# Patient Record
Sex: Female | Born: 1970 | Race: White | Hispanic: No | Marital: Married | Smoking: Never smoker
Health system: Southern US, Community
[De-identification: ages and names within clinical notes are randomized; demographics above are authoritative.]

## PROBLEM LIST (undated history)

## (undated) DIAGNOSIS — K219 Gastro-esophageal reflux disease without esophagitis: Secondary | ICD-10-CM

## (undated) DIAGNOSIS — G35 Multiple sclerosis: Secondary | ICD-10-CM

## (undated) DIAGNOSIS — D649 Anemia, unspecified: Secondary | ICD-10-CM

## (undated) DIAGNOSIS — N3020 Other chronic cystitis without hematuria: Secondary | ICD-10-CM

## (undated) HISTORY — DX: Gastro-esophageal reflux disease without esophagitis: K21.9

## (undated) HISTORY — DX: Anemia, unspecified: D64.9

## (undated) HISTORY — PX: OTHER SURGICAL HISTORY: SHX169

## (undated) HISTORY — DX: Other chronic cystitis without hematuria: N30.20

## (undated) HISTORY — DX: Multiple sclerosis: G35

---

## 2000-04-30 ENCOUNTER — Other Ambulatory Visit: Admission: RE | Admit: 2000-04-30 | Discharge: 2000-04-30 | Payer: Self-pay | Admitting: Obstetrics and Gynecology

## 2000-07-23 ENCOUNTER — Inpatient Hospital Stay (HOSPITAL_COMMUNITY): Admission: AD | Admit: 2000-07-23 | Discharge: 2000-07-23 | Payer: Self-pay | Admitting: Obstetrics & Gynecology

## 2000-10-29 ENCOUNTER — Encounter: Admission: RE | Admit: 2000-10-29 | Discharge: 2000-10-29 | Payer: Self-pay | Admitting: Obstetrics and Gynecology

## 2000-10-29 ENCOUNTER — Encounter: Payer: Self-pay | Admitting: Obstetrics and Gynecology

## 2001-03-04 ENCOUNTER — Encounter: Payer: Self-pay | Admitting: Family Medicine

## 2001-03-04 ENCOUNTER — Encounter: Admission: RE | Admit: 2001-03-04 | Discharge: 2001-03-04 | Payer: Self-pay | Admitting: Family Medicine

## 2001-03-25 ENCOUNTER — Encounter: Payer: Self-pay | Admitting: Obstetrics and Gynecology

## 2001-03-25 ENCOUNTER — Encounter: Admission: RE | Admit: 2001-03-25 | Discharge: 2001-03-25 | Payer: Self-pay | Admitting: Family Medicine

## 2001-06-06 ENCOUNTER — Other Ambulatory Visit: Admission: RE | Admit: 2001-06-06 | Discharge: 2001-06-06 | Payer: Self-pay | Admitting: Obstetrics and Gynecology

## 2001-09-25 ENCOUNTER — Encounter: Admission: RE | Admit: 2001-09-25 | Discharge: 2001-09-25 | Payer: Self-pay | Admitting: Obstetrics and Gynecology

## 2001-09-25 ENCOUNTER — Encounter: Payer: Self-pay | Admitting: Obstetrics and Gynecology

## 2001-09-25 ENCOUNTER — Encounter (INDEPENDENT_AMBULATORY_CARE_PROVIDER_SITE_OTHER): Payer: Self-pay | Admitting: *Deleted

## 2002-09-17 ENCOUNTER — Other Ambulatory Visit: Admission: RE | Admit: 2002-09-17 | Discharge: 2002-09-17 | Payer: Self-pay | Admitting: Obstetrics and Gynecology

## 2003-09-23 ENCOUNTER — Other Ambulatory Visit: Admission: RE | Admit: 2003-09-23 | Discharge: 2003-09-23 | Payer: Self-pay | Admitting: Obstetrics and Gynecology

## 2003-12-15 ENCOUNTER — Ambulatory Visit (HOSPITAL_COMMUNITY): Admission: RE | Admit: 2003-12-15 | Discharge: 2003-12-15 | Payer: Self-pay | Admitting: Obstetrics and Gynecology

## 2004-07-19 ENCOUNTER — Inpatient Hospital Stay (HOSPITAL_COMMUNITY): Admission: AD | Admit: 2004-07-19 | Discharge: 2004-07-23 | Payer: Self-pay | Admitting: Obstetrics and Gynecology

## 2004-07-20 ENCOUNTER — Encounter (INDEPENDENT_AMBULATORY_CARE_PROVIDER_SITE_OTHER): Payer: Self-pay | Admitting: Specialist

## 2004-11-15 ENCOUNTER — Ambulatory Visit: Payer: Self-pay | Admitting: Family Medicine

## 2004-12-19 ENCOUNTER — Ambulatory Visit: Payer: Self-pay | Admitting: Family Medicine

## 2004-12-26 ENCOUNTER — Ambulatory Visit: Payer: Self-pay | Admitting: Family Medicine

## 2005-01-04 ENCOUNTER — Other Ambulatory Visit: Admission: RE | Admit: 2005-01-04 | Discharge: 2005-01-04 | Payer: Self-pay | Admitting: Obstetrics and Gynecology

## 2005-01-09 ENCOUNTER — Ambulatory Visit (HOSPITAL_COMMUNITY): Admission: RE | Admit: 2005-01-09 | Discharge: 2005-01-09 | Payer: Self-pay | Admitting: Obstetrics and Gynecology

## 2005-02-23 ENCOUNTER — Ambulatory Visit: Payer: Self-pay | Admitting: Cardiology

## 2005-03-02 ENCOUNTER — Ambulatory Visit: Payer: Self-pay

## 2005-03-06 ENCOUNTER — Ambulatory Visit: Payer: Self-pay | Admitting: Cardiology

## 2005-03-07 ENCOUNTER — Ambulatory Visit: Payer: Self-pay | Admitting: Internal Medicine

## 2005-03-08 ENCOUNTER — Inpatient Hospital Stay (HOSPITAL_COMMUNITY): Admission: EM | Admit: 2005-03-08 | Discharge: 2005-03-13 | Payer: Self-pay | Admitting: Emergency Medicine

## 2005-03-21 ENCOUNTER — Encounter: Admission: RE | Admit: 2005-03-21 | Discharge: 2005-06-19 | Payer: Self-pay | Admitting: Internal Medicine

## 2005-04-05 ENCOUNTER — Ambulatory Visit: Payer: Self-pay | Admitting: Family Medicine

## 2005-04-18 ENCOUNTER — Ambulatory Visit: Payer: Self-pay | Admitting: Family Medicine

## 2006-01-11 ENCOUNTER — Other Ambulatory Visit: Admission: RE | Admit: 2006-01-11 | Discharge: 2006-01-11 | Payer: Self-pay | Admitting: Obstetrics and Gynecology

## 2006-03-31 IMAGING — CR DG CHEST 2V
2 series · 2 of 2 positions shown · non-contrast
Comparison: none

CLINICAL DATA: Chest pain- left-sided.
 CHEST (TWO VIEWS):

 The heart size and mediastinal contours are normal. The lungs are clear. The visualized skeleton is unremarkable.

[view not recorded (1 of 2)]
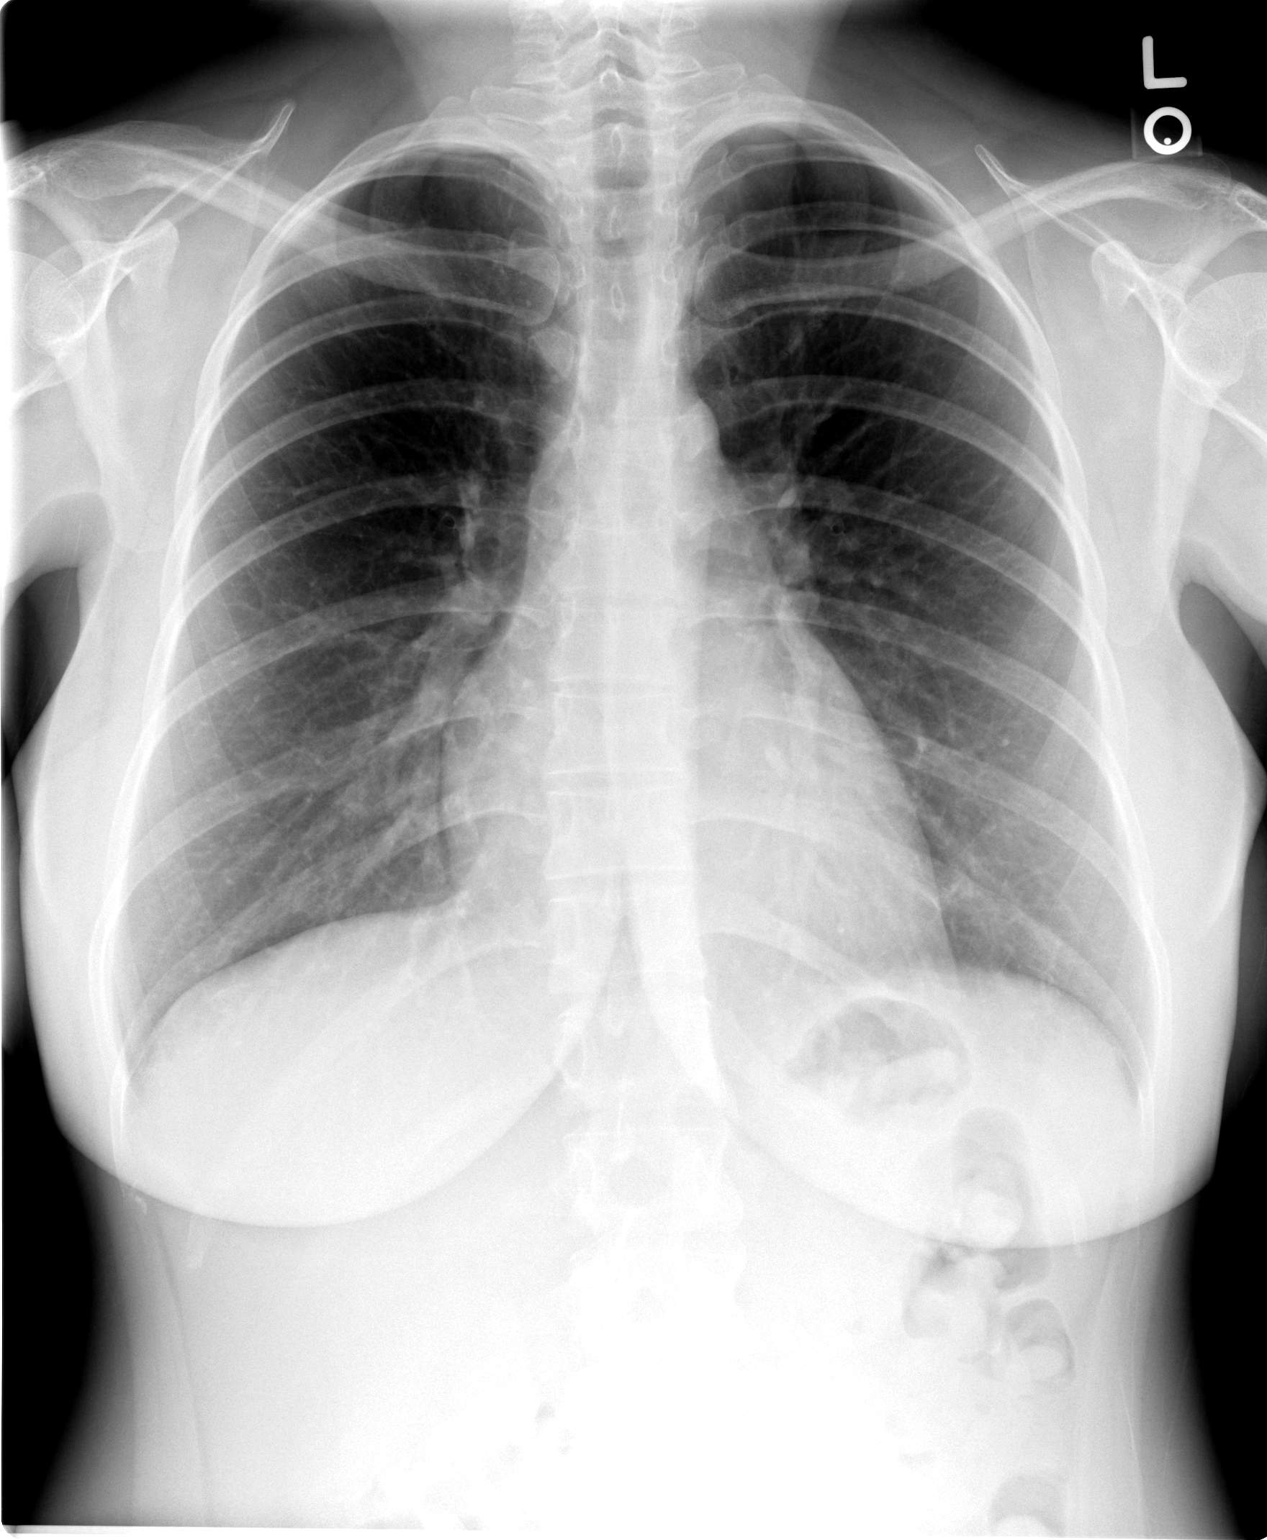

[view not recorded (2 of 2)]
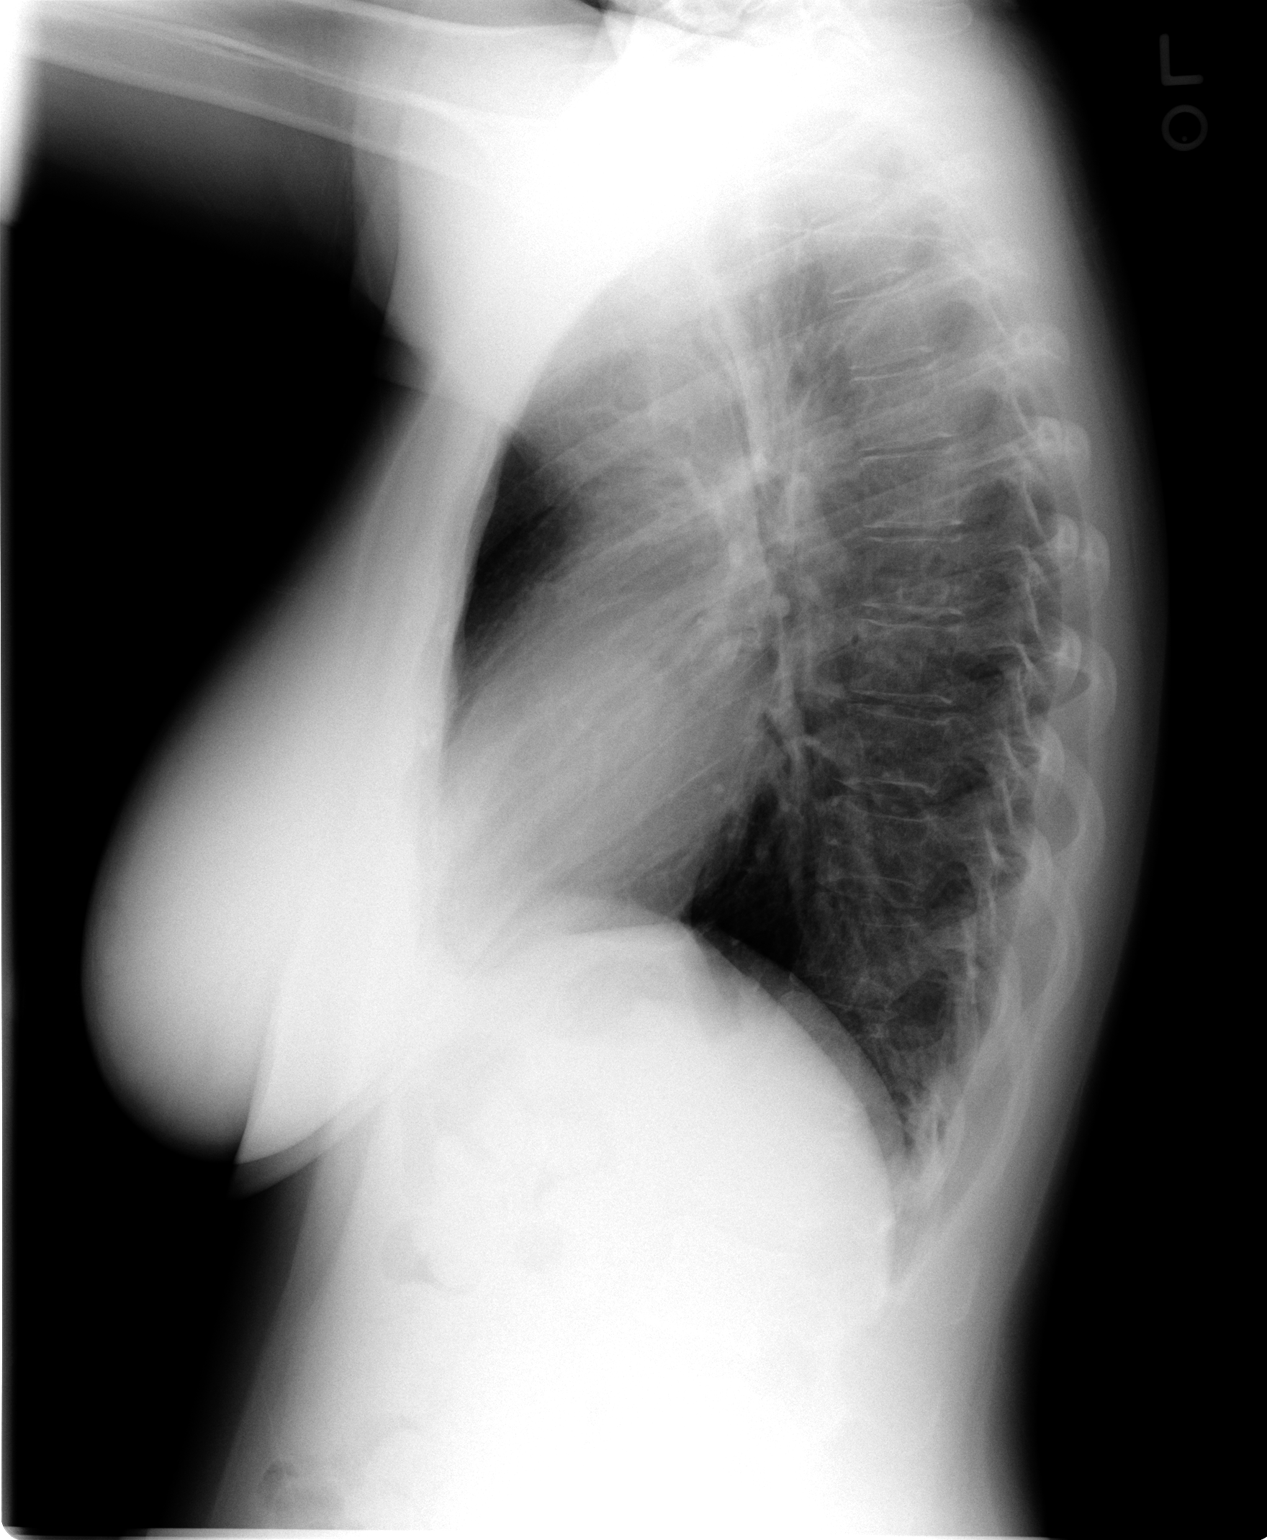

[2 of 2 positions shown; findings below may reference images not displayed]

IMPRESSION: No active disease.

## 2006-04-23 ENCOUNTER — Ambulatory Visit: Payer: Self-pay

## 2006-04-30 ENCOUNTER — Ambulatory Visit: Payer: Self-pay

## 2006-05-08 ENCOUNTER — Ambulatory Visit: Payer: Self-pay

## 2006-05-27 IMAGING — CT CT HEAD W/O CM
1 series · 16 of 30 positions shown, 20 images · IV contrast (agent unspecified)
Comparison: none

CLINICAL DATA: Status post fall.  Right-sided numbness and tingling. 
CT OF HEAD WITHOUT CONTRAST:
Routine unenhanced study was performed.  No comparison.  
  There is no evidence of acute intracranial hemorrhage, mass effect, or extraaxial fluid collection.  There is an ill-defined low attenuation lesion in the right parietal subcortical white matter, measuring approximately 6 mm in diameter on image #18.  No other white matter lesions are identified, and the overlying cortex in this area appears normal.  The ventricles and subarachnoid spaces are appropriately sized for age.  The visualized paranasal sinuses are clear, and the calvarium is intact.

[Series 2: brain · axial · 0.47mm/px · z∈[+99,+234]mm · 16 of 30 slices shown, 20 images]
[im 2/30  brain]
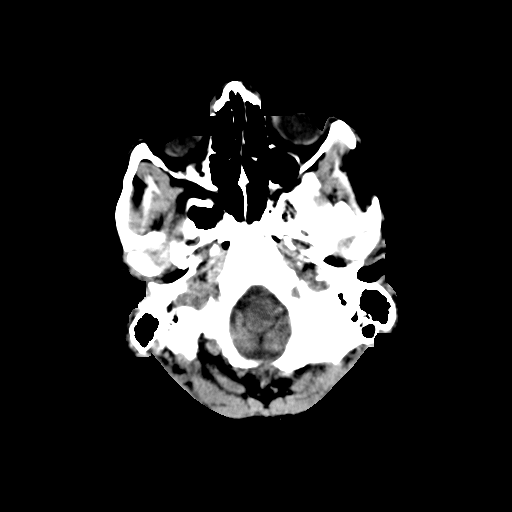
[im 2/30  bone]
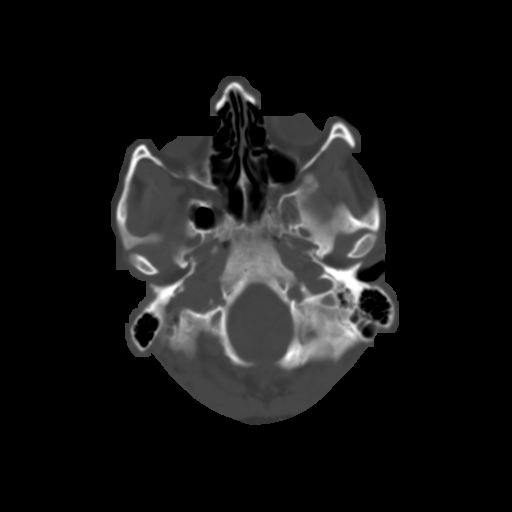
[im 4/30  brain]
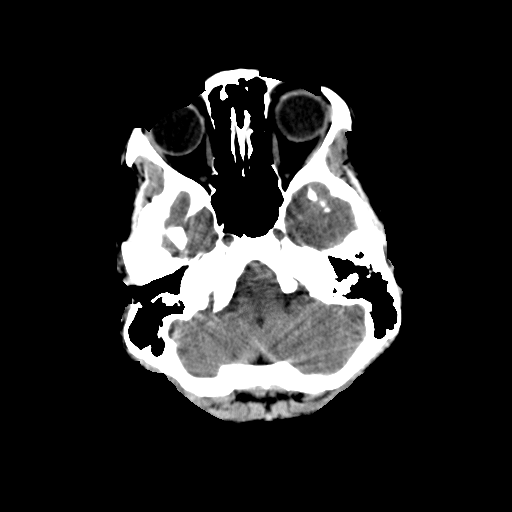
[im 6/30  brain]
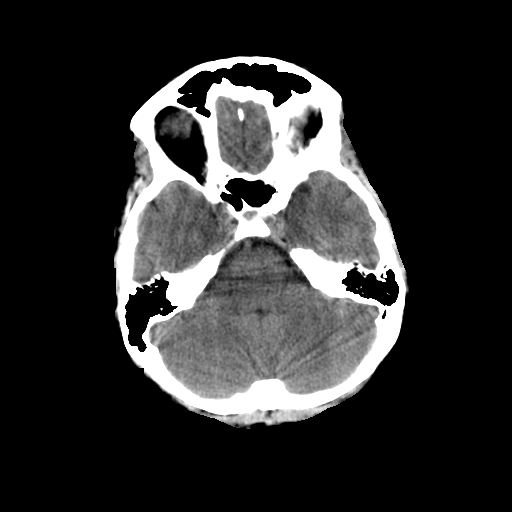
[im 8/30  brain]
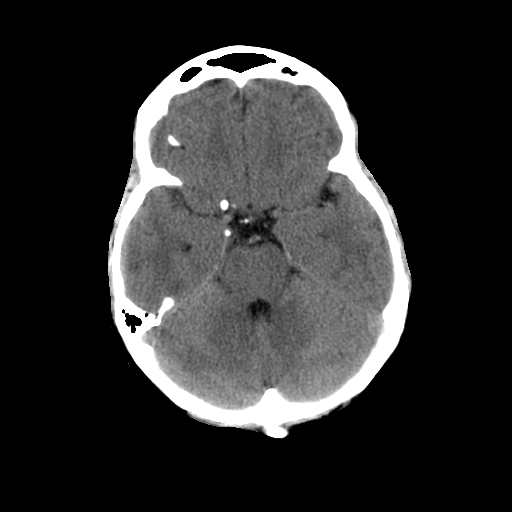
[im 9/30  brain]
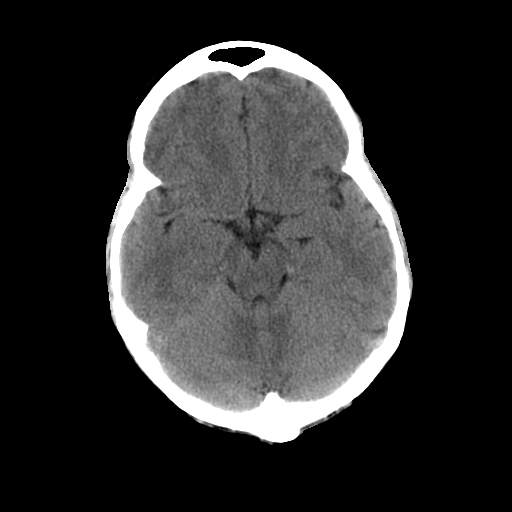
[im 9/30  bone]
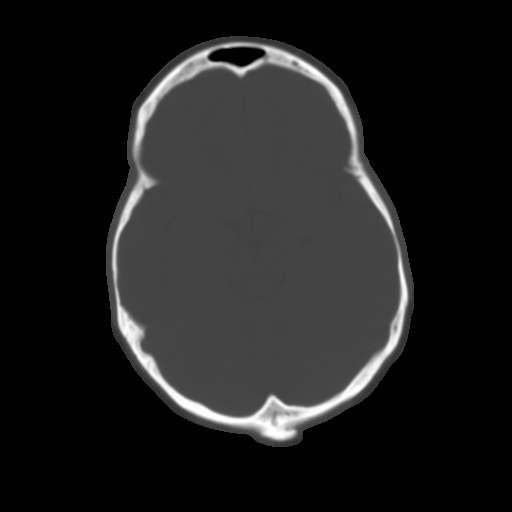
[im 11/30  brain]
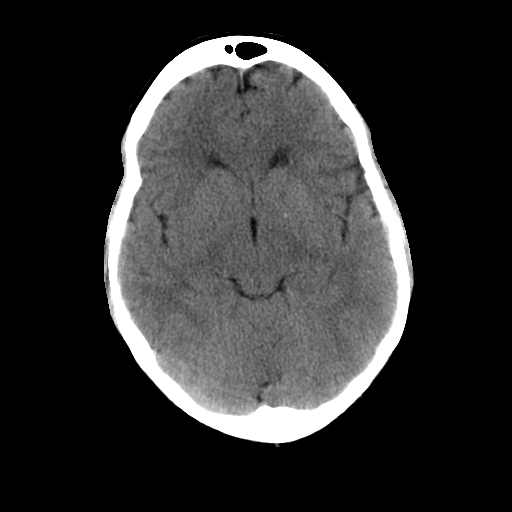
[im 13/30  brain]
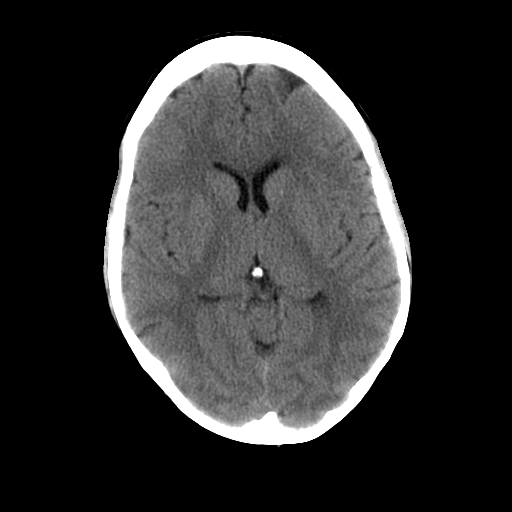
[im 15/30  brain]
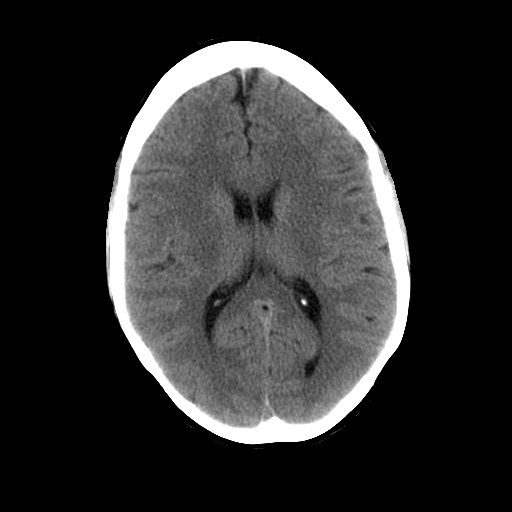
[im 16/30  brain]
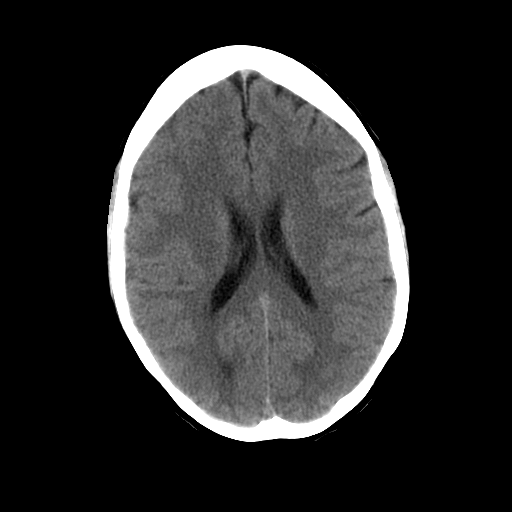
[im 16/30  bone]
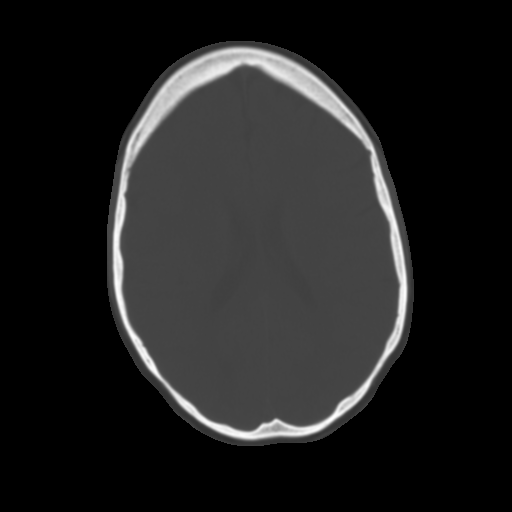
[im 18/30  brain]
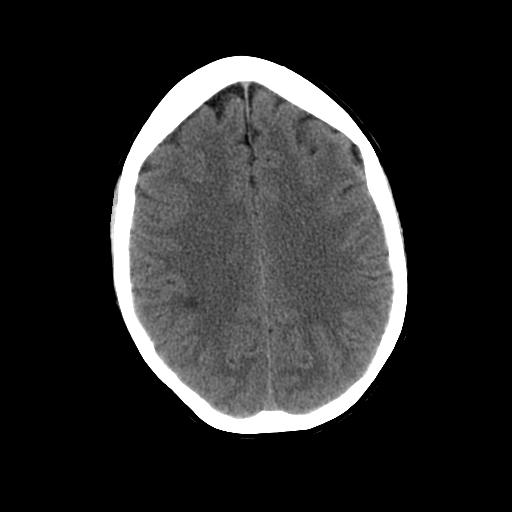
[im 20/30  brain]
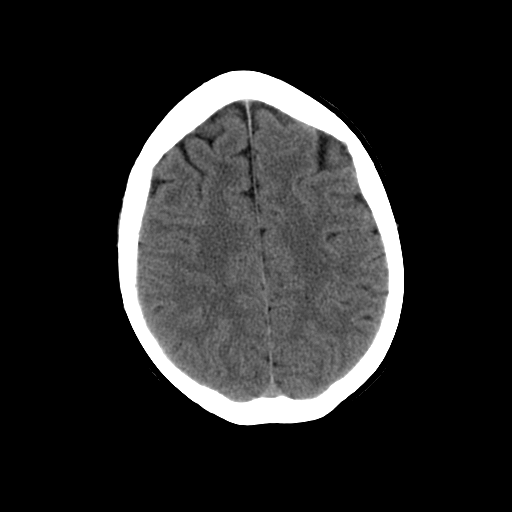
[im 22/30  brain]
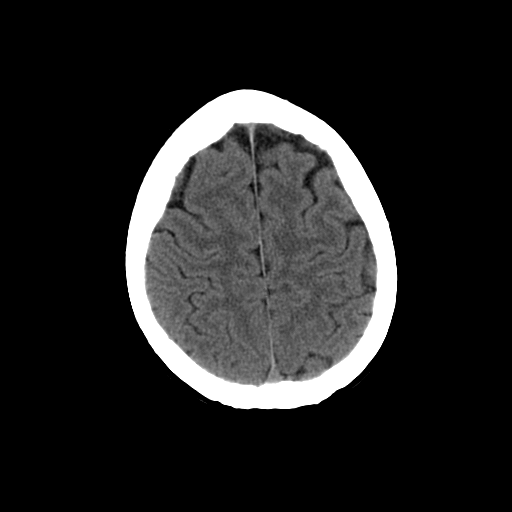
[im 23/30  brain]
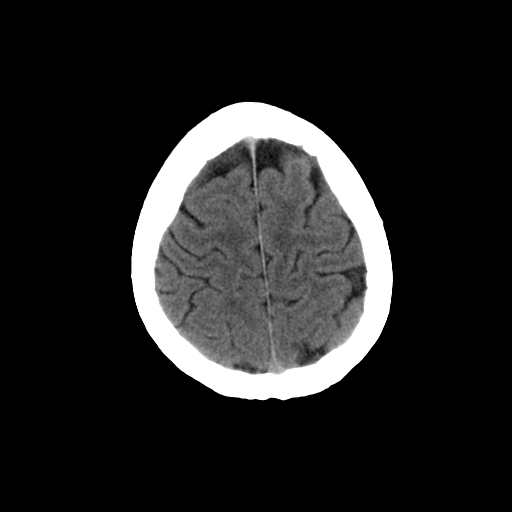
[im 23/30  bone]
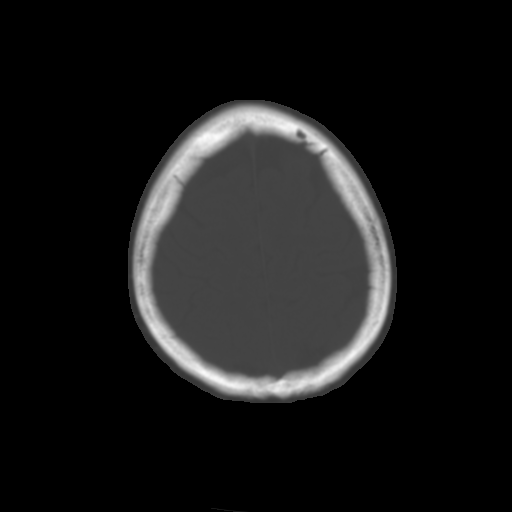
[im 25/30  brain]
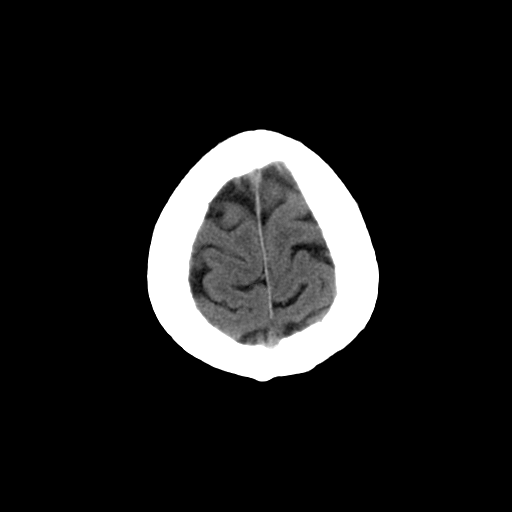
[im 27/30  brain]
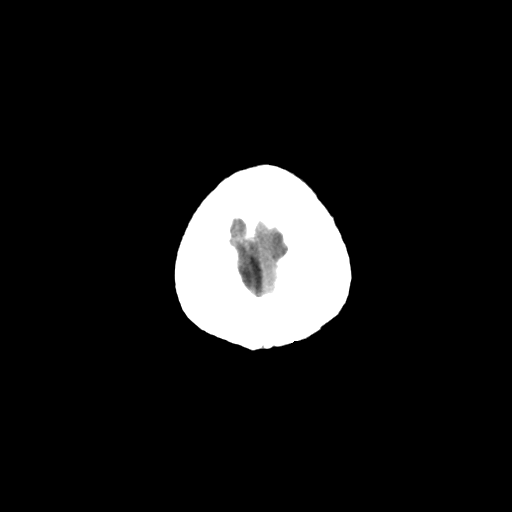
[im 29/30  brain]
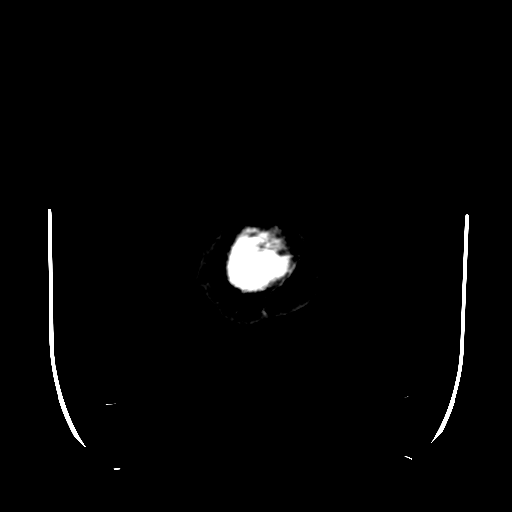

[16 of 30 positions shown; findings below may reference images not displayed]

IMPRESSION: Nonspecific right parietal subcortical white matter lesion could potentially reflect a subacute stroke but would not explain the given symptoms.  Other considerations would include a demyelinating process, the sequela of remote trauma, and less likely neoplasm or infection.  This is a finding which would be better evaluated with MRI of the brain to include postcontrast images.

## 2006-05-29 ENCOUNTER — Ambulatory Visit: Payer: Self-pay

## 2006-06-11 ENCOUNTER — Ambulatory Visit: Payer: Self-pay

## 2006-06-25 ENCOUNTER — Ambulatory Visit: Payer: Self-pay

## 2006-07-03 ENCOUNTER — Ambulatory Visit: Payer: Self-pay

## 2006-07-16 ENCOUNTER — Ambulatory Visit: Payer: Self-pay

## 2006-08-13 ENCOUNTER — Ambulatory Visit: Payer: Self-pay

## 2006-08-20 ENCOUNTER — Ambulatory Visit: Payer: Self-pay

## 2006-08-27 ENCOUNTER — Ambulatory Visit: Payer: Self-pay

## 2006-09-18 ENCOUNTER — Ambulatory Visit: Payer: Self-pay

## 2006-10-02 ENCOUNTER — Ambulatory Visit: Payer: Self-pay

## 2006-10-22 ENCOUNTER — Ambulatory Visit: Payer: Self-pay

## 2007-01-21 ENCOUNTER — Ambulatory Visit: Payer: Self-pay

## 2007-03-11 ENCOUNTER — Ambulatory Visit: Payer: Self-pay

## 2007-06-11 ENCOUNTER — Ambulatory Visit: Payer: Self-pay

## 2008-09-07 ENCOUNTER — Ambulatory Visit: Payer: Self-pay

## 2009-05-13 ENCOUNTER — Encounter: Admission: RE | Admit: 2009-05-13 | Discharge: 2009-05-13 | Payer: Self-pay | Admitting: Gastroenterology

## 2009-06-07 ENCOUNTER — Ambulatory Visit: Payer: Self-pay | Admitting: Cardiology

## 2009-06-07 DIAGNOSIS — R0602 Shortness of breath: Secondary | ICD-10-CM | POA: Insufficient documentation

## 2010-09-19 ENCOUNTER — Encounter: Admission: RE | Admit: 2010-09-19 | Discharge: 2010-09-19 | Payer: Self-pay | Admitting: Psychiatry

## 2010-11-14 ENCOUNTER — Encounter: Admission: RE | Admit: 2010-11-14 | Discharge: 2010-11-14 | Payer: Self-pay | Admitting: Obstetrics and Gynecology

## 2011-03-31 ENCOUNTER — Emergency Department (HOSPITAL_COMMUNITY)
Admission: EM | Admit: 2011-03-31 | Discharge: 2011-03-31 | Disposition: A | Discharge status: Home / Self Care | Payer: Commercial Managed Care - PPO | Attending: Emergency Medicine | Admitting: Emergency Medicine

## 2011-03-31 ENCOUNTER — Emergency Department (HOSPITAL_COMMUNITY): Payer: Commercial Managed Care - PPO

## 2011-03-31 DIAGNOSIS — R51 Headache: Secondary | ICD-10-CM | POA: Insufficient documentation

## 2011-03-31 DIAGNOSIS — S1093XA Contusion of unspecified part of neck, initial encounter: Secondary | ICD-10-CM | POA: Insufficient documentation

## 2011-03-31 DIAGNOSIS — Z79899 Other long term (current) drug therapy: Secondary | ICD-10-CM | POA: Insufficient documentation

## 2011-03-31 DIAGNOSIS — R110 Nausea: Secondary | ICD-10-CM | POA: Insufficient documentation

## 2011-03-31 DIAGNOSIS — Y9229 Other specified public building as the place of occurrence of the external cause: Secondary | ICD-10-CM | POA: Insufficient documentation

## 2011-03-31 DIAGNOSIS — M542 Cervicalgia: Secondary | ICD-10-CM | POA: Insufficient documentation

## 2011-03-31 DIAGNOSIS — K219 Gastro-esophageal reflux disease without esophagitis: Secondary | ICD-10-CM | POA: Insufficient documentation

## 2011-03-31 DIAGNOSIS — R209 Unspecified disturbances of skin sensation: Secondary | ICD-10-CM | POA: Insufficient documentation

## 2011-03-31 DIAGNOSIS — F329 Major depressive disorder, single episode, unspecified: Secondary | ICD-10-CM | POA: Insufficient documentation

## 2011-03-31 DIAGNOSIS — IMO0002 Reserved for concepts with insufficient information to code with codable children: Secondary | ICD-10-CM | POA: Insufficient documentation

## 2011-03-31 DIAGNOSIS — G35 Multiple sclerosis: Secondary | ICD-10-CM | POA: Insufficient documentation

## 2011-03-31 DIAGNOSIS — F3289 Other specified depressive episodes: Secondary | ICD-10-CM | POA: Insufficient documentation

## 2011-03-31 DIAGNOSIS — S0003XA Contusion of scalp, initial encounter: Secondary | ICD-10-CM | POA: Insufficient documentation

## 2011-04-24 ENCOUNTER — Other Ambulatory Visit: Payer: Self-pay | Admitting: Psychiatry

## 2011-04-24 DIAGNOSIS — G35 Multiple sclerosis: Secondary | ICD-10-CM

## 2011-04-27 NOTE — Discharge Summary (Signed)
NAME:  Savannah Kennedy, Savannah Kennedy                     ACCOUNT NO.:  [id number]   MEDICAL RECORD NO.:  [id number]                   PATIENT TYPE:   LOCATION:                                       FACILITY:   PHYSICIAN:  Malva Limes, M.D.                 DATE OF BIRTH:   DATE OF ADMISSION:  07/19/2004  DATE OF DISCHARGE:  07/23/2004                                 DISCHARGE SUMMARY   FINAL DIAGNOSES:  1.  Intrauterine pregnancy at [redacted] weeks gestation.  2.  Arrest of the active stage of labor.   PROCEDURE:  Primary low flap transverse cesarean section.   SURGEON:  Dr. Miguel Aschoff.   COMPLICATIONS:  None.   This 40 year old G3 P0-0-1-0 presents at [redacted] weeks gestation with spontaneous  rupture of membranes.  The patient's antepartum course up to this point had  been uncomplicated.  She did have a positive group B strep culture performed  in the office at 35 weeks.  The patient's only other problem is she has had  a history of reflux that she was on Protonix for throughout her pregnancy.  Otherwise, it had been uncomplicated.  The patient was admitted at this  time.  She was given clindamycin for her positive group B strep secondary to  a PENICILLIN allergy.  The patient was induced because of the rupture of  membranes and progressed to a rim, but had arrest of labor at this point  with no progress for over 2 hours and became febrile.  The anterior lip did  note to be become swollen and because of this arrest of dilatation with  associated fever and slow progression, the patient was taken to the  operating room to undergo a cesarean section.  She was taken to the  operating room by Dr. Miguel Aschoff where a primary low transverse cesarean  section was performed with the delivery of a 7-pound 8-ounce female infant  with Apgars of 8 and 9.  Delivery went without complications.  The patient's  postoperative course was benign without any significant complications.  She  was having some difficulty  focusing on postoperative day #1 but had no  headaches and no other symptoms.  By postoperative day #2 her eyes were  feeling much better and she was felt ready for discharge on postoperative  day #3.  She was sent home on a regular diet, told to decrease activities,  told to restart her Protonix for her reflux, was given Vicodin one to two  q.4h. as needed for pain, told she could use ibuprofen q.6h. as needed for  pain, told to continue her prenatal vitamins.  She was also told to follow  up in the office in 4 weeks.   LABORATORY DATA ON DISCHARGE:  The patient had a hemoglobin of 8.3, white  blood cell count of 11.3.     Leilani Able, P.A.-C.  Malva Limes, M.D.    MB/MEDQ  D:  08/21/2004  T:  08/21/2004  Job:  [id number]

## 2011-04-27 NOTE — Op Note (Signed)
NAMESAVAHANNA, Savannah Kennedy                   ACCOUNT NO.:  [id number]   MEDICAL RECORD NO.:  [id number]                   PATIENT TYPE:  INP   LOCATION:  9137                                 FACILITY:  [facility]   PHYSICIAN:  Miguel Aschoff, M.D.                    DATE OF BIRTH:  1971/05/26   DATE OF PROCEDURE:  07/21/2004  DATE OF DISCHARGE:                                 OPERATIVE REPORT   PREOPERATIVE DIAGNOSIS:  Intrauterine pregnancy at 38 weeks with arrest of  the active stage of labor   POSTOPERATIVE DIAGNOSIS:  Intrauterine pregnancy at 38 weeks with arrest of  the active stage of labor.  Delivery of viable female infant, Apgar 8 and 9.   PROCEDURE:  Primary low flap transverse cesarean section.   SURGEON:  Dr. Miguel Aschoff   ANESTHESIA:  Epidural.   COMPLICATIONS:  None.   JUSTIFICATION:  The patient is a 40 year old white female, gravida 2, para 0-  0-1-0, at 60 weeks, who presented to [facility] with spontaneous  rupture of membranes.  The patient was induced because of rupture of  membranes and progressed to a rim but had arrest of labor at this point with  no progress for over 2 hours.  She then became febrile.  The anterior lip  previously noted became more swollen and due to this arrest of dilatation  with the associated fever and very slow progress falling her off the normal  Friedman curve, she was taken to operating room to undergo primary cesarean  section.  The risks and benefits of the procedure were discussed with the  patient.   DESCRIPTION OF PROCEDURE:  The patient was taken to the operating room,  placed in a supine position, and prepped and draped in the usual sterile  fashion.  Her previously placed epidural anesthetic was reinjected, and a  satisfactory level of anesthesia was achieved without difficulty.  At this  point, a Pfannenstiel incision was made, extended down through the  subcutaneous tissue with bleeding points being clamped and coagulated  as  they were encountered.  The fascia was then identified and incised  transversely, separated from the underlying rectus muscles.  The rectus  muscles were divided in the midline.  Peritoneum was then found and entered,  carefully avoiding underlying structures.  A peritoneal incision was  extended under direct visualization.  At this point, a bladder flap was  created and protected with the bladder blade.  Then an elliptical transverse  incision was made into the lower uterine segment.  The amniotic cavity was  entered.  Clear fluid was obtained and, at this point, the patient was  delivered of a viable female infant, Apgars 8 at 1 minute and 9 at 5 minutes  from a vertex ROA position.  Nuchal cord x 1 was noted.  The baby was handed  to the pediatric team in attendance.  Cord bloods were obtained for  appropriate studies.  The placenta was delivered.  The uterus was evacuated  of any remaining products of conception.  At this point, the angles of the  uterine incision were ligated using figure-of-eight sutures of #1 Vicryl.  Then the uterus was closed in layers.  The first layer was a running  interlocking suture of #1 Vicryl followed by an imbricating suture of #1  Vicryl.  Once this was done, the bladder flap was reapproximated using  running continuous 2-0 Vicryl suture.  Inspection was then made for  hemostasis.  Hemostasis appeared to be excellent and at this point, lap  counts were taken, found to be correct, and then the abdomen was closed.  The parietal peritoneum was closed using running continuous 0 Vicryl suture.  The fascia was closed using 2 sutures of 0 Vicryl, each starting at the  lateral fascial angles and then meeting in the midline.  The subcutaneous  tissue was closed using interrupted 0 Vicryl sutures, and  the skin incision was closed using staples.  The estimated blood loss was  approximately 600 mL.  The patient tolerated the procedure well and was  taken to the  recovery room in satisfactory condition.  The baby was taken to  the nursery in satisfactory condition.                                               Miguel Aschoff, M.D.    AR/MEDQ  D:  07/21/2004  T:  07/23/2004  Job:  [id number]

## 2011-04-27 NOTE — Procedures (Signed)
HISTORY:  The patient is 39 year old woman with a hemiplegia being evaluated  to rule out multiple sclerosis.   PROCEDURE:  The tracing is carried with a 32 x 32 check pattern oscillating  at 1.9 Hz.  A 250 msec period was displayed following with stimulus  artifact.  Filters ranged between 1-100 Hz (low to high) and 100 stimuli  were averaged in duplicate to provide the final results.   DESCRIPTION OF FINDINGS:  The left eye produced well-defined wave forms with  excellent correlation.  Latencies were as follows:  N1: 65.5, P1: 94.8, N2:  124.1.   Similarly, stimulation of the right eye produced well-defined wave forms  with excellent correlation.  Latencies were as follows:  N1: 68.4, P1: 95.8,  N2: 124.1.   IMPRESSION:  These pattern reversal visual responses are within normal  limits and showed no evidence of conduction abnormality in the anterior  visual pathways of either eye.      JYN:WGNF  D:  03/09/2005 19:26:56  T:  03/10/2005 08:25:39  Job #:  [id number]   cc:   Melvyn Novas, M.D.  [address]  [address]  [place]  [place] [address]  Fax: [phone]

## 2011-04-27 NOTE — H&P (Signed)
Savannah Kennedy, GUTTER         ACCOUNT NO.:  [id number]   MEDICAL RECORD NO.:  [id number]          PATIENT TYPE:  EMS   LOCATION:  MAJO                         FACILITY:  [facility]   PHYSICIAN:  Georgina Quint. Plotnikov, M.D. LHCDATE OF BIRTH:  1971-09-07   DATE OF ADMISSION:  03/07/2005  DATE OF DISCHARGE:                                HISTORY & PHYSICAL   CHIEF COMPLAINT:  Left-sided weakness, tingling, and numbness.   HISTORY OF PRESENT ILLNESS:  The patient is a 40 year old female who was  going downstairs at around 4:30 p.m. when she abruptly lost sensation of her  left leg, left arm, and fell.  Her mother said that the fall was quite hard.  She denies loss of consciousness.  She has been having tingling and numbness  in different parts of her body lasting from 20 minutes to several hours,  headaches, head heaviness, occasional blurred vision for several months'  duration.  Usually the symptoms are more pronounced on the left side than on  the right and mostly they are described as a heavy sensation.  She thinks  that the symptoms started in August of 2005 when she had her baby.  She had  a cesarean section.  She had a brain MRI at [facility] 2 weeks  ago which apparently was normal.  She saw Carolan Shiver, M.D. for dizziness  and vertigo.  She saw [name], M.D. and apparently had a normal  echocardiogram and an EKG.  She has an appointment with Clabe Seal. Meryl Crutch,  M.D. as I understand in 2 weeks.   PAST MEDICAL HISTORY:  Unremarkable.  Her pregnancy was terminated in 2001  early due to nausea and vomiting, as I understand.  She had a cesarean  section in August of 2005.   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  No alcohol, drugs,  or tobacco.   FAMILY HISTORY:  Both aunts had mini-strokes in their 30's or 40's, she is  not clear about the details.  No miscarriages or blood clots in the family.   REVIEW OF SYSTEMS:  As above, some memory  problems, some anxiety.  Headaches  have improved.  No abdominal pains.  No syncope.  No injuries.   CURRENT MEDICATIONS:  Ch[place] for allergies p.r.n.  Not on birth  control pills.  No supplements.   ALLERGIES:  GUAIFENESIN, ERYTHROMYCIN.   PHYSICAL EXAMINATION:  VITAL SIGNS:  Temperature 99.9, blood pressure  132/92, heart rate 100, respirations 22, saturations normal.  GENERAL:  She is in no acute distress.  Anxious.  HEENT:  Moist mucosa.  NECK:  Supple.  No thyromegaly or bruit.  LUNGS:  Clear.  No wheezes or rales.  HEART:  S1 and S2.  No murmur and no gallop.  ABDOMEN:  Soft, nontender, no organomegaly, and no mass felt.  EXTREMITIES:  Lower extremities without edema.  Calves nontender.  Muscle  strength examination reveals 5-/5 left foot extensors, 5-/5 left arm  extensors and flexors.  Left grip is only slightly decreased.  There is no  facial droop.  Speech is normal.  Pupils are reactive.  She is alert,  oriented, and cooperative.   LABORATORY DATA:  CT scan showed right parietal white matter lesion,  subacute versus old CVA versus demyelinating process versus old injury.   Glucose 106, urinalysis 3 to 6 WBC's, CBC's normal.   EKG with normal sinus rhythm.   Pregnancy urine test negative.   ASSESSMENT:  1.  Left hemiparesis with left-sided paresthesias.  Admit to telemetry.  The      diagnosis is unclear.  Will give aspirin.  Neurology consultation in the      morning.  Obtain blood work including B12, TSH, sed rate,      hypercoagulable panel, and others.  2.  Right parietal lesion.  Per patient she had a normal MRI at [facility] 2 weeks ago.  Will obtain the report and will obtain MRI/MRA.  3.  Anxiety.  Will use Ativan p.r.n. and Valium.  4.  Possible urinary tract infection.  Will treat with a short course of      antibiotic.      AVP/MEDQ  D:  03/08/2005  T:  03/08/2005  Job:  [id number]   cc:   Laurita Quint, M.D.  [address] [place]  [place] [address]  Fax: [phone]   [name], M.D.   Miguel Aschoff, M.D.  [address]  [place]  [place] [address]  Fax: [phone]   Carolan Shiver, M.D.  Fax: [phone]

## 2011-04-27 NOTE — Consult Note (Signed)
NAMEJANIA, Savannah Kennedy NO.:  [id number]   MEDICAL RECORD NO.:  [id number]          PATIENT TYPE:  INP   LOCATION:  3024                         FACILITY:  [facility]   PHYSICIAN:  Melvyn Novas, M.D.  DATE OF BIRTH:  [birth year]   DATE OF CONSULTATION:  DATE OF DISCHARGE:                                   CONSULTATION   HISTORY OF PRESENT ILLNESS:  [name] is a 40 year old,  married, Caucasian, right-handed female, who presents with the sudden onset  of left hemiparesis yesterday which caused her to fall.  Preceding symptoms  have been pretty much since she delivered her baby eight months ago with  left/right hemisensory impairment in the form of tingling and numbness.  Sometimes, this has involved the left body and left leg, but she does not  remember having sensory loss over the face.  She also does not state that  she had any cognitive or cranial nerve deficits, but she was very tearful  and was diagnosed with postnatal depression.  Recently, she had complained  about transient blurring of vision, especially in the morning.  There have  been many days when she did not have any symptoms, and then she would have  sudden recurrence of sensory loss or blurring of vision for a couple of  hours.  Normally, the onset was seen in the morning.  Delivery of her first  baby was a normal vaginal delivery with epidural anesthesia.  She has had no  recent medication changes, and she is currently not nursing.  The patient  states that she had a MRI at [facility] which was reported as negative for  lesions on February 20, 2005.   FAMILY HISTORY:  The patient has a grandfather with Parkinson's syndrome.  Her parents are healthy.   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  She has not been  working outside the home for the last year.   PHYSICAL EXAMINATION:  VITAL SIGNS:  Respiratory rate 16, vital signs are  stable.  The patient is afebrile at 97.8.   Blood pressure, which was taken  after the patient became very tearful and upset, was 123/80; heart rate 88,  is now 78 and regular.  GENERAL:  No edema, no cyanosis, or clubbing.  No lymph node extension, neck  vein distention, no goiters noticed.  The patient is  atraumatic/normocephalic.  Her fall has not led to any bruising.  The  patient needed assistance to walk to the bathroom today, but feels that she  has had good regainment of her left-sided strength.  MENTAL STATUS:  Alert and oriented x3, fluent speech, appropriate memory and  concentration capacity.  CRANIAL NERVE EXAMINATION: Pupils reactive bi-  equally to light, however, the left side seems to be slower.  The patient  has normal full extraocular movements and bilateral equal visual fields.  No  cataracts.  No papilledema.  No facial droop.  Tongue and uvula are midline.  Range of motion for the neck is full.  There is no shoulder shrug asymmetry.  MOTOR EXAM: Shows equal grip, but the left intrinsic finger muscles  seem to  be weaker, and she cannot spread her fingers with the same strength and  resistance as on the right.  She has equal strength for antigravity, elbow  flexion, elbow extension, dorsiflexion, and plantar flexion.  There is no  cogwheeling and no spasticity.  The deep tendon reflexes are equal  bilaterally, but the patient did present with an upgoing toe on the left.  Sensory at this time is equal to touch.  She has no remaining numbness,  tingling, or burning.  COORDINATION:  The patient said that she felt dizzy earlier this morning and  describes a vertigo spell.  Finger-nose here is intact, and there is no  nystagmus noticed.  She could not perform the Romberg, as she felt unable to  stand up for me, but she can sit up in the bed without any recurrence of  ataxia or truncal dysmetria.  There is no tremor and no ataxia noted on  finger-nose.   ASSESSMENT:  Very likely, by the onset of sensory symptoms,  visual symptoms,  and now motor symptoms, a course of multiple sclerosis in this 40 year old  Caucasian female with recent delivery of her first baby.  MRI here will  confirm demyelination.  CSF is pending, but would actually not be needed to  confirm the diagnosis given the periventricular distribution of a  demyelinating lesion and a confluent demyelination between corpus callosum  and subcortical U-fibers in the right brain.   PLAN:  The patient will receive Solu-Medrol 1 g IV today, and we might give  it for 3 or 4 weeks, perhaps even 5, depending on her progression.  Protonix  will also be given p.o. 40 mg a day.  She will be evaluated by PT and OT,  and visual evoked potentials were ordered.  The patient is currently too  upset to do a spinal tap.  I have suggested to use p.r.n. Xanax.  She might  also need something to sleep tonight after she receives steroids IV.  Follow-  up visit in 14 days.      CD/MEDQ  D:  03/08/2005  T:  03/08/2005  Job:  [id number]   cc:   Georgina Quint. Plotnikov, M.D. [facility]   [facility]

## 2011-04-27 NOTE — Discharge Summary (Signed)
Savannah Kennedy, Savannah Kennedy         ACCOUNT NO.:  [id number]   MEDICAL RECORD NO.:  [id number]          PATIENT TYPE:  INP   LOCATION:  3024                         FACILITY:  [facility]   PHYSICIAN:  Rene Paci, M.D. LHCDATE OF BIRTH:  02-22-1971   DATE OF ADMISSION:  03/08/2005  DATE OF DISCHARGE:  03/12/2005                                 DISCHARGE SUMMARY   DISCHARGE DIAGNOSES:  1.  Left sided hemiparesis and paresthesias.  2.  Relapsing remitting multiple sclerosis.  3.  Anxiety.   BRIEF HISTORY AND PHYSICAL:  Ms. Rumler is a 40 year old white female  who was descending the stairs when she abruptly lost sensation of her left  leg and arm. She did fall. She did not lose consciousness. The patient  describes almost an eight month history of paresthesias, blurred vision,  headaches. The etiology of those symptoms has not been clear. Reportedly she  had an MRI of the brain two weeks prior to this admission which was normal.   PAST MEDICAL HISTORY:  Status post C section in August 2005.   HOSPITAL COURSE:  #1.  NEURO.  The patient presented with left hemiparesis  and paresthesias. A head CT was done that revealed no definite acute  intracranial abnormalities; however, there was white parietal white matter  lesions nonspecific. The differential diagnosis included subacute old stroke  versus demyelinating process versus prior trauma. An MRI of the brain was  scheduled and the patient was seen in consultation by neurology. The patient  was seen in consultation by Dr. Vickey Huger. Given the patient's onset of  sensory symptoms, visual symptoms, and motor symptoms, a course of MS was  the highest differential. MRI did confirm demyelination. LP for CSF was  ordered to rule out an infectious process. This is still pending. The  patient was started on Solu-Medrol 1 g IV q. 24 hours for 5 days and then a  steroid taper. The patient was seen by physical therapist who recommended  outpatient physical therapy. We will also arrange for an outpatient  occupational therapy evaluation as well. The patient will followup closely  with Dr. Vickey Huger in the office.  #2.  ANXIETY.  Very much situational and relieved with Ativan.  #3.  ID.  The patient was treated with a short course of Cipro for UTI.  #4.  Medications at discharge:  1.  Prednisone taper over the next 18 days.  2.  Pepcid 20 mg q.d. for 18 days.  3.  Ativan 1 mg 1/2 to 1 tab q.8 hours p.r.n.   FOLLOW UP:  Followup with Melvyn Novas, M.D. the 3rd or 4th week in  April, Dr. Hetty Ely in 1-2 weeks or as needed.      LC/MEDQ  D:  03/12/2005  T:  03/12/2005  Job:  [id number]   cc:   Laurita Quint, M.D.  [address] [place]  [place] [address]  Fax: [phone]   Melvyn Novas, M.D.  [address]  [address]  [place]  [place] [address]  Fax: [phone]   Carolan Shiver, M.D.  Fax: [phone]

## 2011-07-16 ENCOUNTER — Ambulatory Visit
Admission: RE | Admit: 2011-07-16 | Discharge: 2011-07-16 | Disposition: A | Discharge status: Home / Self Care | Payer: Commercial Managed Care - PPO | Source: Ambulatory Visit | Attending: Psychiatry | Admitting: Psychiatry

## 2011-07-16 DIAGNOSIS — G35 Multiple sclerosis: Secondary | ICD-10-CM

## 2011-07-16 MED ORDER — GADOBENATE DIMEGLUMINE 529 MG/ML IV SOLN
13.0000 mL | Freq: Once | INTRAVENOUS | Status: AC | PRN
  Administered 2011-07-16: 13 mL via INTRAVENOUS

## 2012-02-22 ENCOUNTER — Ambulatory Visit: Payer: BC Managed Care – PPO

## 2012-02-22 ENCOUNTER — Telehealth: Payer: Self-pay | Admitting: *Deleted

## 2012-02-22 ENCOUNTER — Ambulatory Visit (HOSPITAL_BASED_OUTPATIENT_CLINIC_OR_DEPARTMENT_OTHER): Payer: BC Managed Care – PPO | Admitting: Hematology and Oncology

## 2012-02-22 ENCOUNTER — Encounter: Payer: Self-pay | Admitting: Hematology and Oncology

## 2012-02-22 ENCOUNTER — Telehealth: Payer: Self-pay | Admitting: Hematology and Oncology

## 2012-02-22 ENCOUNTER — Ambulatory Visit (HOSPITAL_BASED_OUTPATIENT_CLINIC_OR_DEPARTMENT_OTHER): Payer: BC Managed Care – PPO | Admitting: Lab

## 2012-02-22 ENCOUNTER — Other Ambulatory Visit: Payer: Self-pay | Admitting: Nurse Practitioner

## 2012-02-22 VITALS — BP 123/79 | HR 104 | Temp 96.9°F | Ht 64.0 in | Wt 148.7 lb

## 2012-02-22 DIAGNOSIS — R5383 Other fatigue: Secondary | ICD-10-CM | POA: Insufficient documentation

## 2012-02-22 DIAGNOSIS — G35D Multiple sclerosis, unspecified: Secondary | ICD-10-CM | POA: Insufficient documentation

## 2012-02-22 DIAGNOSIS — G35 Multiple sclerosis: Secondary | ICD-10-CM

## 2012-02-22 DIAGNOSIS — R0789 Other chest pain: Secondary | ICD-10-CM

## 2012-02-22 DIAGNOSIS — D649 Anemia, unspecified: Secondary | ICD-10-CM

## 2012-02-22 DIAGNOSIS — D539 Nutritional anemia, unspecified: Secondary | ICD-10-CM

## 2012-02-22 LAB — COMPREHENSIVE METABOLIC PANEL
ALT: 21 U/L (ref 0–35)
BUN: 16 mg/dL (ref 6–23)
CO2: 26 mEq/L (ref 19–32)
Calcium: 9.1 mg/dL (ref 8.4–10.5)
Chloride: 102 mEq/L (ref 96–112)
Creatinine, Ser: 0.93 mg/dL (ref 0.50–1.10)
Glucose, Bld: 101 mg/dL — ABNORMAL HIGH (ref 70–99)
Total Bilirubin: 0.2 mg/dL — ABNORMAL LOW (ref 0.3–1.2)

## 2012-02-22 LAB — CBC WITH DIFFERENTIAL/PLATELET
BASO%: 0 % (ref 0.0–2.0)
Basophils Absolute: 0 10*3/uL (ref 0.0–0.1)
EOS%: 1 % (ref 0.0–7.0)
HGB: 10.2 g/dL — ABNORMAL LOW (ref 11.6–15.9)
MCH: 30.5 pg (ref 25.1–34.0)
MCHC: 33.4 g/dL (ref 31.5–36.0)
MCV: 91.5 fL (ref 79.5–101.0)
MONO%: 8.6 % (ref 0.0–14.0)
RBC: 3.33 10*6/uL — ABNORMAL LOW (ref 3.70–5.45)
RDW: 14.4 % (ref 11.2–14.5)

## 2012-02-22 LAB — URINALYSIS, MICROSCOPIC - CHCC
Blood: NEGATIVE
Glucose: NEGATIVE g/dL
Leukocyte Esterase: NEGATIVE
Nitrite: NEGATIVE
Protein: NEGATIVE mg/dL
Specific Gravity, Urine: 1.025 (ref 1.003–1.035)

## 2012-02-22 LAB — MORPHOLOGY

## 2012-02-22 LAB — LACTATE DEHYDROGENASE: LDH: 207 U/L (ref 94–250)

## 2012-02-22 NOTE — Telephone Encounter (Signed)
called pt and scheduled appt for 03/15.  will fax over a letter to Dr. Zachery Dauer office with appt d/t

## 2012-02-22 NOTE — Progress Notes (Signed)
Dr. Lin Givens- [facility]- MS Dr. Duane Lope- Gyn Dr. Zachery Dauer- Primary Care Dr. Helane Rima- psychiatry Dr. McDiermond- [facility]

## 2012-02-22 NOTE — Patient Instructions (Signed)
Patient to follow up as instructed.   No current outpatient prescriptions on file.        March 2013  Sunday Monday Tuesday Wednesday Thursday Friday Saturday                  1   2    3   4   5   6   7   8   9    10   11   12   13   14   15    FINANCIAL COUNSELING   1:00 PM  (30 min.)  [facility]  [facility]   NEW PATIENT 60   1:30 PM  (60 min.)  Laurice Record, MD  [facility] 16    17   18   19   20   21   22   23    24   25   26   27   28   29   30     31

## 2012-02-22 NOTE — Progress Notes (Signed)
This office note has been dictated.

## 2012-02-22 NOTE — Telephone Encounter (Signed)
Rec'd referral on this patient from Dr Camillo Flaming for evaluation of anemia. Patient with remote history in 2010 of iron def anemia.  Patient with history of MS for approx 6 years. Neurologist is Dr Harriette Bouillon in [facility]. Patient states she called and notified them we may be calling for records. Faxed urgent request to get any labs/ov/notes on patient for continuity of care to help with visit today.

## 2012-02-22 NOTE — Telephone Encounter (Signed)
Referred by Dr. Camillo Flaming Dx- Anemia

## 2012-02-23 LAB — IRON AND TIBC
TIBC: 411 ug/dL (ref 250–470)
UIBC: 367 ug/dL (ref 125–400)

## 2012-02-23 LAB — TSH: TSH: 2.519 u[IU]/mL (ref 0.350–4.500)

## 2012-02-23 LAB — FOLATE: Folate: >20 ng/mL

## 2012-02-23 LAB — VITAMIN B12: Vitamin B-12: 864 pg/mL (ref 211–911)

## 2012-02-23 LAB — DIRECT ANTIGLOBULIN TEST (NOT AT ARMC): DAT IgG: NEGATIVE

## 2012-02-25 NOTE — Progress Notes (Signed)
CC:   Savannah Kennedy, M.D. Savannah Kennedy, M.D. Savannah Kennedy, M.D. Savannah Buckler, MD, Fax [phone]  IDENTIFYING STATEMENT:  The patient is a 41 year old woman seen at the request of Dr. Zachery Kennedy with anemia.  HISTORY OF PRESENT ILLNESS:  The patient has a past medical history significant for multiple sclerosis diagnosed 6 years ago.  She has tried a number of therapies but is currently on Gilenya and has been on it for a year and a half.  She notes ongoing fatigue.  At times the patient describes fatigue can be debilitating.  She has also noted recently increasing shortness of breath, especially of the last 2 weeks with left lower sided chest discomfort.  She also states that she occasionally becomes lightheaded.  She denies extremity weakness.  She denies blurring of vision.  She was diagnosed with cystitis a week and a half ago, and was placed on chronic antibiotic for which she has not taken as yet.  She had blood work performed at Dr. Tommas Kennedy office and CBC notes the following; 02/13/2012 a white cell count 4.8, hemoglobin 9.9, hematocrit 30.2, platelets 259; 10/13/2010 white cell count 8.7, hemoglobin 12.2, hematocrit 34.1, platelets 235.  Iron studies performed fairly recently notes a TIBC of 383, iron 120, saturation 21%, transferrin 274.  She has had no history of rectal bleeding.  She denies hematuria.  She states she eats a well-balanced diet.  She takes oral vitamin B12 supplementation although was never told that she had vitamin B12 deficiency.  PAST MEDICAL HISTORY: 1. Multiple sclerosis. 2. GERD. 3. Status post C-section. 4. Status post endoscopy. 5. Recent diagnosis of chronic cystitis. 6. Depression.  MEDICATIONS: 1. Wellbutrin 150 mg daily. 2. Calcium carbonate 1 tablet as needed. 3. Vitamin D 1000 units 2 tablets daily. 4. Klonopin 0.5 mg 4 times daily as needed. 5. Ferrous sulfate 27 mg daily. 6. Allegra 30 mg as needed. 7. Gilenya 0.5 mg every other  day. 8. Fish Oil 1 g daily. 9. Prozac 80 mg daily. 10.Oral contraceptive pill. 11.Multivitamin 1 capsule daily. 12.MiraLAX 17 g daily as needed. 13.AcipHex 20 mg daily. 14.Vitamin B12 3000 mcg daily.  ALLERGIES:  Erythromycin and guaifenesin derivatives.  SOCIAL HISTORY:  The patient is married with 1 child.  She denies alcohol, tobacco use.  She is a stay at home mom.  FAMILY HISTORY:  The patient had 2 maternal aunts and a maternal cousin that had iron deficiency anemia.  REVIEW OF SYSTEMS:  Denies fever, chills, night sweats, anorexia, weight loss.  Denies pain.  Notes fatigue but is somewhat able to perform some activities of daily living.  GI:  Denies nausea, vomiting, abdominal discomfort although prone to reflux.  Denies constipation, rectal bleeding.  GU:  Denies hematuria, nocturia, but admits to dysuria. Cardiovascular:  Admits to left-sided chest pain.  Denies ankle swelling, PND, orthopnea.  Respiration:  Denies cough, hemoptysis, wheeze, shortness of breath.  Neurologic:  Denies headaches, vision changes, extremity weakness.  Skin:  No rashes.  Rest of review of systems negative.  PHYSICAL EXAMINATION:  General:  Patient is alert, oriented x3.  Vitals: Pulse 104, blood pressure 120/79, temperature 96.9, respirations 20, weight 148 pounds.  HEENT:  Head is atraumatic, normocephalic.  Sclerae anicteric.  Pupils equal, round, reactive to light.  Mouth moist without ulcerations, thrush or lesions.  Neck:  Supple.  Chest:  Clear to percussion and auscultation.  CVS:  Unremarkable.  Abdomen:  Soft, nontender.  No hepatomegaly.  Bowel sounds present.  Extremities:  No edema.  Skin:  No bruising.  Lymph nodes:  No adenopathy.  CNS: Nonfocal.  IMPRESSION AND PLAN:  [name] is a 41 year old woman referred for anemia.  She has multiple sclerosis and is on Gilenya.  Her hemoglobin is down a little, and I also note that her iron levels performed at Dr. Tommas Kennedy office  were adequate.  She takes little iron supplementation.  We need to check a ferritin level as this will give a better indication of iron stores.  I have her return to lab to repeat CBC and will view smear.  Will also obtain vitamin B12 level.  Rule out hemolysis with Coombs and haptoglobin.  In addition, will also assess her thyroid function.  I have given her stool cards to obtain occult blood testing x3, and also urinalysis.  However, I am concerned about her ongoing chest discomfort.  I am not quite sure how of this is related to stress versus anxiety versus primary pathology but I think we need to rule out a pulmonary embolism so CT angiogram will be ordered. We spent more than half the time discussing the potential causes and treatment of anemia in relation to multiple sclerosis.  She returns to discuss results.    ______________________________ Savannah Kennedy, M.D. LIO/MEDQ  D:  02/22/2012  T:  02/22/2012  Job:  [id number]

## 2012-02-26 ENCOUNTER — Ambulatory Visit (HOSPITAL_BASED_OUTPATIENT_CLINIC_OR_DEPARTMENT_OTHER): Payer: BC Managed Care – PPO

## 2012-02-26 ENCOUNTER — Other Ambulatory Visit: Payer: Self-pay | Admitting: *Deleted

## 2012-02-26 ENCOUNTER — Ambulatory Visit (HOSPITAL_COMMUNITY)
Admission: RE | Admit: 2012-02-26 | Discharge: 2012-02-26 | Disposition: A | Discharge status: Home / Self Care | Payer: BC Managed Care – PPO | Source: Ambulatory Visit | Attending: Hematology and Oncology | Admitting: Hematology and Oncology

## 2012-02-26 DIAGNOSIS — D649 Anemia, unspecified: Secondary | ICD-10-CM

## 2012-02-26 DIAGNOSIS — R911 Solitary pulmonary nodule: Secondary | ICD-10-CM | POA: Insufficient documentation

## 2012-02-26 DIAGNOSIS — D539 Nutritional anemia, unspecified: Secondary | ICD-10-CM

## 2012-02-26 DIAGNOSIS — R0602 Shortness of breath: Secondary | ICD-10-CM | POA: Insufficient documentation

## 2012-02-26 DIAGNOSIS — G35 Multiple sclerosis: Secondary | ICD-10-CM

## 2012-02-26 DIAGNOSIS — R5383 Other fatigue: Secondary | ICD-10-CM

## 2012-02-26 DIAGNOSIS — R5381 Other malaise: Secondary | ICD-10-CM | POA: Insufficient documentation

## 2012-02-26 MED ORDER — IOHEXOL 300 MG/ML  SOLN
80.0000 mL | Freq: Once | INTRAMUSCULAR | Status: AC | PRN
  Administered 2012-02-26: 80 mL via INTRAVENOUS

## 2012-02-26 MED ORDER — IOHEXOL 300 MG/ML  SOLN: 80.0000 mL | Freq: Once | INTRAMUSCULAR | Status: AC | PRN

## 2012-02-28 ENCOUNTER — Ambulatory Visit: Payer: Commercial Managed Care - PPO | Admitting: Hematology and Oncology

## 2012-02-28 ENCOUNTER — Ambulatory Visit (HOSPITAL_BASED_OUTPATIENT_CLINIC_OR_DEPARTMENT_OTHER): Payer: BC Managed Care – PPO | Admitting: Lab

## 2012-02-28 ENCOUNTER — Ambulatory Visit (HOSPITAL_BASED_OUTPATIENT_CLINIC_OR_DEPARTMENT_OTHER): Payer: BC Managed Care – PPO | Admitting: Hematology and Oncology

## 2012-02-28 VITALS — BP 121/78 | HR 102 | Temp 97.4°F | Ht 64.0 in | Wt 149.0 lb

## 2012-02-28 DIAGNOSIS — D649 Anemia, unspecified: Secondary | ICD-10-CM

## 2012-02-28 DIAGNOSIS — N3020 Other chronic cystitis without hematuria: Secondary | ICD-10-CM

## 2012-02-28 DIAGNOSIS — D539 Nutritional anemia, unspecified: Secondary | ICD-10-CM

## 2012-02-28 DIAGNOSIS — G35 Multiple sclerosis: Secondary | ICD-10-CM

## 2012-02-28 LAB — CBC WITH DIFFERENTIAL (CANCER CENTER ONLY)
BASO#: 0 10*3/uL (ref 0.0–0.2)
BASO%: 0.3 % (ref 0.0–2.0)
EOS%: 1.4 % (ref 0.0–7.0)
HCT: 32.7 % — ABNORMAL LOW (ref 34.8–46.6)
HGB: 10.6 g/dL — ABNORMAL LOW (ref 11.6–15.9)
LYMPH#: 0.5 10*3/uL — ABNORMAL LOW (ref 0.9–3.3)
MONO#: 0.4 10*3/uL (ref 0.1–0.9)
NEUT#: 2.7 10*3/uL (ref 1.5–6.5)
NEUT%: 73.5 % (ref 39.6–80.0)
RBC: 3.5 10*6/uL — ABNORMAL LOW (ref 3.70–5.32)
WBC: 3.7 10*3/uL — ABNORMAL LOW (ref 3.9–10.0)

## 2012-02-28 NOTE — Progress Notes (Signed)
This office note has been dictated.

## 2012-02-28 NOTE — Progress Notes (Signed)
CC:   Harriette Bouillon, MD Miguel Aschoff, M.D. Etta Grandchild, M.D. Juluis Rainier, MD, Fax [phone]  IDENTIFYING STATEMENT:  The patient is a 41 year old woman who presents to discuss results.  INTERIM HISTORY:  In summary, the patient was referred with anemia.  She has a past medical history for multiple sclerosis diagnosed 6-7 years ago.  She is on Gilenya.  On presentation, she had noted ongoing fatigue.  She also described shortness of breath.  She was diagnosed with chronic cystitis and was placed on antibiotics which she had yet to take, but has taken since her last followup visit with me.  She underwent hematology evaluation, and results are as follows:  02/22/2012:  White cell count 5.1, hemoglobin 10.2, hematocrit 30.5, platelets 299, ANC 4000; peripheral smear showed slight polychromasia; there was no evidence of hemolysis as haptoglobin was at 294 and Coombs negative; vitamin B12 was 864, iron 44, TIBC 411, ferritin 136, saturation 11%; TSH was normal at 2.519; urinalysis revealed no blood; occult blood testing x3 was negative.  The patient also received a CT angiogram of the chest as she had been complaining of increasing shortness of breath.  This showed no evidence of pulmonary embolism. There was a stable 3 mm right upper lobe pulmonary nodule since 2007. There was no thoracic adenopathy or pericardial effusion.  The patient reports that since beginning her antibiotic her fatigue is better.  She, however, is under a lot of stress.  MEDICATIONS:  Oral iron, rest of the medicines reviewed and updated.  ALLERGIES:  Erythromycin and guaifenesin derivatives.  PAST MEDICAL HISTORY:  Unchanged.  FAMILY HISTORY:  Unchanged.  SOCIAL HISTORY:  Unchanged.  REVIEW OF SYSTEMS:  As above.  PHYSICAL EXAMINATION:  General:  The patient is a well-appearing, well- nourished woman in no distress.  Vitals:  Pulse 102, blood pressure 121/78, temperature 97.4, respirations 16.   Weight 149 pounds.  HEENT: Head is atraumatic, normocephalic.  Sclerae anicteric.  Mouth moist. Neck:  Supple.  Chest:  Clear.  CVS:  Unremarkable.  Abdomen:  Soft, nontender.  Bowel sounds present.  Extremities:  No edema.  LABORATORY DATA:  As above.  In addition, sodium 136, potassium 3.8, chloride 102, CO2 26, BUN 16, creatinine 0.93, glucose 101, T bili 0.2, alkaline phosphatase 58, AST 20, ALT 21, calcium 9.1.  IMPRESSION AND PLAN:  Mrs. Moret is a 41 year old woman with anemia of chronic disease.  She has both multiple sclerosis and chronic cystitis, which may have a role in her slightly lowish hemoglobin.  It is currently 10 and marginally better than it was before.  So with this said, she does not need IV iron therapy.  She is taking very small doses of oral iron, which I have asked her to discontinue for the time being. Her white cell count, specifically ANC, is adequate.  This is monitored by Dr. Zachery Dauer and her neurologist.  So with this said, she was reassured.  She will see me 1 more time in about 4 months' time.    ______________________________ Laurice Record, M.D. LIO/MEDQ  D:  02/28/2012  T:  02/28/2012  Job:  [id number]

## 2012-02-28 NOTE — Patient Instructions (Signed)
Patient to follow up as instructed.  

## 2012-06-20 ENCOUNTER — Other Ambulatory Visit (HOSPITAL_BASED_OUTPATIENT_CLINIC_OR_DEPARTMENT_OTHER): Payer: BC Managed Care – PPO | Admitting: Lab

## 2012-06-20 ENCOUNTER — Other Ambulatory Visit: Payer: Self-pay | Admitting: *Deleted

## 2012-06-20 ENCOUNTER — Telehealth: Payer: Self-pay | Admitting: *Deleted

## 2012-06-20 DIAGNOSIS — D509 Iron deficiency anemia, unspecified: Secondary | ICD-10-CM

## 2012-06-20 DIAGNOSIS — D539 Nutritional anemia, unspecified: Secondary | ICD-10-CM

## 2012-06-20 DIAGNOSIS — D638 Anemia in other chronic diseases classified elsewhere: Secondary | ICD-10-CM

## 2012-06-20 LAB — CBC WITH DIFFERENTIAL/PLATELET
Basophils Absolute: 0 10*3/uL (ref 0.0–0.1)
EOS%: 0.6 % (ref 0.0–7.0)
Eosinophils Absolute: 0 10*3/uL (ref 0.0–0.5)
HCT: 34 % — ABNORMAL LOW (ref 34.8–46.6)
HGB: 11.3 g/dL — ABNORMAL LOW (ref 11.6–15.9)
MCH: 29.8 pg (ref 25.1–34.0)
MCV: 89.8 fL (ref 79.5–101.0)
NEUT#: 2.9 10*3/uL (ref 1.5–6.5)
NEUT%: 73.9 % (ref 38.4–76.8)
RDW: 12.8 % (ref 11.2–14.5)
lymph#: 0.7 10*3/uL — ABNORMAL LOW (ref 0.9–3.3)

## 2012-06-20 LAB — BASIC METABOLIC PANEL
BUN: 12 mg/dL (ref 6–23)
Chloride: 105 mEq/L (ref 96–112)
Creatinine, Ser: 0.9 mg/dL (ref 0.50–1.10)
Glucose, Bld: 91 mg/dL (ref 70–99)
Potassium: 4.3 mEq/L (ref 3.5–5.3)

## 2012-06-20 NOTE — Telephone Encounter (Signed)
Spoke with pt while in lab dept today.   Pt agreed  to come in for f/u  At  12 pm  On  06/24/12.

## 2012-06-24 ENCOUNTER — Encounter: Payer: Self-pay | Admitting: Hematology and Oncology

## 2012-06-24 ENCOUNTER — Ambulatory Visit (HOSPITAL_BASED_OUTPATIENT_CLINIC_OR_DEPARTMENT_OTHER): Payer: BC Managed Care – PPO | Admitting: Hematology and Oncology

## 2012-06-24 ENCOUNTER — Telehealth: Payer: Self-pay | Admitting: Hematology and Oncology

## 2012-06-24 VITALS — BP 118/76 | HR 95 | Temp 97.2°F | Ht 64.0 in | Wt 146.5 lb

## 2012-06-24 DIAGNOSIS — G35 Multiple sclerosis: Secondary | ICD-10-CM

## 2012-06-24 DIAGNOSIS — D509 Iron deficiency anemia, unspecified: Secondary | ICD-10-CM

## 2012-06-24 DIAGNOSIS — D638 Anemia in other chronic diseases classified elsewhere: Secondary | ICD-10-CM

## 2012-06-24 DIAGNOSIS — N3020 Other chronic cystitis without hematuria: Secondary | ICD-10-CM

## 2012-06-24 DIAGNOSIS — D539 Nutritional anemia, unspecified: Secondary | ICD-10-CM

## 2012-06-24 NOTE — Patient Instructions (Addendum)
Savannah Kennedy  [id number]  [facility] Discharge Instructions  RECOMMENDATIONS MADE BY THE CONSULTANT AND ANY TEST RESULTS WILL BE SENT TO YOUR REFERRING DOCTOR.   EXAM FINDINGS BY MD TODAY AND SIGNS AND SYMPTOMS TO REPORT TO CLINIC OR PRIMARY MD:   Your current list of medications are: Current Outpatient Prescriptions  Medication Sig Dispense Refill  . buPROPion (WELLBUTRIN XL) 150 MG 24 hr tablet Take 150 mg by mouth daily.      . calcium carbonate (TUMS - DOSED IN MG ELEMENTAL CALCIUM) 500 MG chewable tablet Chew 1 tablet by mouth as needed.      . cholecalciferol (VITAMIN D) 1000 UNITS tablet Take 2,000 Units by mouth daily.      . clonazePAM (KLONOPIN) 0.5 MG tablet Take 0.5 mg by mouth at bedtime as needed.       . Ferrous Sulfate (RA IRON) 27 MG TABS Take 1 tablet by mouth. "Nature's Choice" OTC      . fexofenadine (ALLEGRA) 30 MG tablet Take 30 mg by mouth as needed.      . Fingolimod HCl (GILENYA) 0.5 MG CAPS Take 1 each by mouth every other day.      . fish oil-omega-3 fatty acids 1000 MG capsule Take 1 g by mouth daily.      Marland Kitchen FLUoxetine (PROZAC) 40 MG capsule Take 80 mg by mouth daily.      Clelia Schaumann Estrad 91-Day (SEASONIQUE PO) Take 1 tablet by mouth daily.      . Multiple Vitamin (MULTIVITAMIN) capsule Take 1 capsule by mouth daily.      . nitrofurantoin (MACRODANTIN) 25 MG capsule Take 25 mg by mouth daily.      . polyethylene glycol (MIRALAX / GLYCOLAX) packet Take 17 g by mouth as needed.      . RABEprazole (ACIPHEX) 20 MG tablet Take 20 mg by mouth daily.      . vitamin B-12 (CYANOCOBALAMIN) 1000 MCG tablet Take 3,000 mcg by mouth daily.         INSTRUCTIONS GIVEN AND DISCUSSED:   SPECIAL INSTRUCTIONS/FOLLOW-UP:  See above.  I acknowledge that I have been informed and understand all the instructions given to me and received a copy. I do not have any more questions at this time, but understand that I may call the [facility] at [phone] during business hours should I have any further questions or need assistance in obtaining follow-up care.

## 2012-06-24 NOTE — Progress Notes (Signed)
This office note has been dictated.

## 2012-06-24 NOTE — Telephone Encounter (Signed)
gv pt appt schedule for January 2014.  °

## 2012-06-24 NOTE — Progress Notes (Signed)
CC:   Savannah Rainier, MD, Fax [phone] [name], M.D. [name] [name], MD  IDENTIFYING STATEMENT:  The patient is a 41 year old woman with anemia who presents for followup.  INTERVAL HISTORY:  The patient was last seen 3 months ago.  She has chronic cystitis for which she is on chronic antibiotics.  She is taking Gilenya every other day.  Feels much better.  Denies bleeding.  Is not fatigued.  Most up-to-date labs noted below.  MEDICATIONS:  Oral iron once daily.  Rest of medicines reviewed and updated.  ALLERGIES:  E-mycin and guaifenesin.  PHYSICAL EXAM:  General:  The patient is a well-appearing, well- nourished woman in no distress.  Vitals:  Pulse 95, blood pressure 118/76, temperature 97.2, respirations 20, weight 146.5 pounds.  HEENT: Head is atraumatic, normocephalic.  Sclerae anicteric.  Mouth moist. Chest:  Clear.  CVS:  Unremarkable.  Abdomen:  Soft, nontender.  Bowel sounds present.  Extremities:  No edema.  LABORATORY DATA:  On 06/20/2012 white cell count 4, hemoglobin 11.3, hematocrit 34, platelets 249.  Sodium 138, potassium 4.3, chloride 105, CO2 24, BUN 12, creatinine 0.9, calcium 8.9.  Ferritin 98 (136).  IMPRESSION AND PLAN:  [name] is a 41 year old woman with anemia of chronic disease as well as mild iron deficiency anemia; has multiple sclerosis and chronic cystitis.  Hemoglobin is better.  She will continue with oral iron as she is already doing.  She follows up in 6 months' time with labs.    ______________________________ Laurice Record, M.D. LIO/MEDQ  D:  06/24/2012  T:  06/24/2012  Job:  [id number]

## 2012-07-29 ENCOUNTER — Other Ambulatory Visit: Payer: Self-pay | Admitting: Psychiatry

## 2012-07-29 DIAGNOSIS — G35 Multiple sclerosis: Secondary | ICD-10-CM

## 2012-08-23 ENCOUNTER — Ambulatory Visit
Admission: RE | Admit: 2012-08-23 | Discharge: 2012-08-23 | Disposition: A | Discharge status: Home / Self Care | Payer: BC Managed Care – PPO | Source: Ambulatory Visit | Attending: Psychiatry | Admitting: Psychiatry

## 2012-08-23 ENCOUNTER — Other Ambulatory Visit: Payer: BC Managed Care – PPO

## 2012-08-23 DIAGNOSIS — G35 Multiple sclerosis: Secondary | ICD-10-CM

## 2012-08-23 DIAGNOSIS — G35D Multiple sclerosis, unspecified: Secondary | ICD-10-CM

## 2012-08-23 MED ORDER — GADOBENATE DIMEGLUMINE 529 MG/ML IV SOLN
14.0000 mL | Freq: Once | INTRAVENOUS | Status: AC | PRN
  Administered 2012-08-23: 14 mL via INTRAVENOUS

## 2012-11-29 ENCOUNTER — Telehealth: Payer: Self-pay | Admitting: *Deleted

## 2012-11-29 NOTE — Telephone Encounter (Signed)
Pt was called on both contact numbers listed. Message was left on cell phone stating that my name and i was calling from [facility] to reschedule appts on 12/25/2012. Pt was asked to call [phone] to reschedule appts

## 2012-12-09 ENCOUNTER — Telehealth: Payer: Self-pay | Admitting: Oncology

## 2012-12-09 NOTE — Telephone Encounter (Signed)
Former pt of LO re-establishing w/HH. Changed time/provider for 1/16 appt to [facility] @ 10am. lmonvm for pt asking that she call office re changes made to 1/16 appt. Also s/w pt husband and he will have pt call us back re appts.

## 2012-12-13 ENCOUNTER — Encounter: Payer: Self-pay | Admitting: *Deleted

## 2012-12-22 ENCOUNTER — Other Ambulatory Visit: Payer: BC Managed Care – PPO | Admitting: Lab

## 2012-12-24 ENCOUNTER — Telehealth: Payer: Self-pay | Admitting: Oncology

## 2012-12-24 NOTE — Telephone Encounter (Signed)
Called pt re r/s 1/13 lb and to see if she will keep 1/16 f/u appt w/HH.

## 2012-12-25 ENCOUNTER — Ambulatory Visit: Payer: BC Managed Care – PPO | Admitting: Hematology and Oncology

## 2012-12-25 ENCOUNTER — Ambulatory Visit: Payer: BC Managed Care – PPO | Admitting: Oncology

## 2014-03-23 ENCOUNTER — Other Ambulatory Visit: Payer: Self-pay | Admitting: Psychiatry

## 2014-03-23 DIAGNOSIS — G35 Multiple sclerosis: Secondary | ICD-10-CM

## 2014-03-29 ENCOUNTER — Inpatient Hospital Stay: Admission: RE | Admit: 2014-03-29 | Payer: BC Managed Care – PPO | Source: Ambulatory Visit

## 2014-05-19 ENCOUNTER — Other Ambulatory Visit: Payer: Self-pay | Admitting: Obstetrics and Gynecology

## 2014-05-21 LAB — CYTOLOGY - PAP

## 2015-01-20 ENCOUNTER — Other Ambulatory Visit: Payer: Self-pay | Admitting: Obstetrics and Gynecology

## 2015-06-21 ENCOUNTER — Other Ambulatory Visit: Payer: Self-pay | Admitting: Obstetrics and Gynecology

## 2015-08-02 ENCOUNTER — Other Ambulatory Visit: Payer: Self-pay | Admitting: Radiology

## 2015-11-05 ENCOUNTER — Encounter (HOSPITAL_COMMUNITY): Payer: Self-pay | Admitting: *Deleted

## 2015-11-05 ENCOUNTER — Emergency Department (HOSPITAL_COMMUNITY): Payer: BLUE CROSS/BLUE SHIELD

## 2015-11-05 ENCOUNTER — Emergency Department (HOSPITAL_COMMUNITY)
Admission: EM | Admit: 2015-11-05 | Discharge: 2015-11-05 | Disposition: A | Discharge status: Home / Self Care | Payer: BLUE CROSS/BLUE SHIELD | Attending: Emergency Medicine | Admitting: Emergency Medicine

## 2015-11-05 DIAGNOSIS — D649 Anemia, unspecified: Secondary | ICD-10-CM | POA: Insufficient documentation

## 2015-11-05 DIAGNOSIS — Y9289 Other specified places as the place of occurrence of the external cause: Secondary | ICD-10-CM | POA: Insufficient documentation

## 2015-11-05 DIAGNOSIS — S199XXA Unspecified injury of neck, initial encounter: Secondary | ICD-10-CM | POA: Diagnosis present

## 2015-11-05 DIAGNOSIS — Y998 Other external cause status: Secondary | ICD-10-CM | POA: Diagnosis not present

## 2015-11-05 DIAGNOSIS — Y9389 Activity, other specified: Secondary | ICD-10-CM | POA: Diagnosis not present

## 2015-11-05 DIAGNOSIS — S3992XA Unspecified injury of lower back, initial encounter: Secondary | ICD-10-CM | POA: Diagnosis not present

## 2015-11-05 DIAGNOSIS — S4992XA Unspecified injury of left shoulder and upper arm, initial encounter: Secondary | ICD-10-CM | POA: Diagnosis not present

## 2015-11-05 DIAGNOSIS — M545 Low back pain, unspecified: Secondary | ICD-10-CM

## 2015-11-05 DIAGNOSIS — Z79899 Other long term (current) drug therapy: Secondary | ICD-10-CM | POA: Insufficient documentation

## 2015-11-05 DIAGNOSIS — M25512 Pain in left shoulder: Secondary | ICD-10-CM

## 2015-11-05 DIAGNOSIS — K219 Gastro-esophageal reflux disease without esophagitis: Secondary | ICD-10-CM | POA: Insufficient documentation

## 2015-11-05 DIAGNOSIS — S3991XA Unspecified injury of abdomen, initial encounter: Secondary | ICD-10-CM | POA: Diagnosis not present

## 2015-11-05 MED ORDER — IBUPROFEN 400 MG PO TABS
600.0000 mg | ORAL_TABLET | Freq: Once | ORAL | Status: AC
  Administered 2015-11-05: 600 mg via ORAL
  Filled 2015-11-05: qty 1

## 2015-11-05 MED ORDER — IBUPROFEN 600 MG PO TABS: 600.0000 mg | ORAL_TABLET | Freq: Four times a day (QID) | ORAL | Status: AC | PRN

## 2015-11-05 MED ORDER — CYCLOBENZAPRINE HCL 10 MG PO TABS: 5.0000 mg | ORAL_TABLET | Freq: Two times a day (BID) | ORAL | Status: AC | PRN

## 2015-11-05 NOTE — ED Notes (Signed)
Patient continues to have soreness all over  She is alert and oriented.  Awaiting xray
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
## 2015-11-05 NOTE — Discharge Instructions (Signed)

## 2016-01-16 ENCOUNTER — Other Ambulatory Visit: Payer: Self-pay | Admitting: Obstetrics and Gynecology

## 2016-07-04 ENCOUNTER — Other Ambulatory Visit: Payer: Self-pay | Admitting: Obstetrics and Gynecology

## 2016-07-05 LAB — CYTOLOGY - PAP

## 2016-09-10 ENCOUNTER — Other Ambulatory Visit: Payer: Self-pay | Admitting: Psychiatry

## 2016-09-10 DIAGNOSIS — G35 Multiple sclerosis: Secondary | ICD-10-CM

## 2016-09-20 ENCOUNTER — Other Ambulatory Visit: Payer: Self-pay | Admitting: Obstetrics and Gynecology

## 2016-10-14 ENCOUNTER — Encounter (HOSPITAL_COMMUNITY): Payer: Self-pay | Admitting: Emergency Medicine

## 2016-10-14 ENCOUNTER — Ambulatory Visit (HOSPITAL_COMMUNITY)
Admission: EM | Admit: 2016-10-14 | Discharge: 2016-10-14 | Disposition: A | Discharge status: Home / Self Care | Payer: BLUE CROSS/BLUE SHIELD | Attending: Emergency Medicine | Admitting: Emergency Medicine

## 2016-10-14 ENCOUNTER — Ambulatory Visit (INDEPENDENT_AMBULATORY_CARE_PROVIDER_SITE_OTHER): Payer: BLUE CROSS/BLUE SHIELD

## 2016-10-14 DIAGNOSIS — S92354A Nondisplaced fracture of fifth metatarsal bone, right foot, initial encounter for closed fracture: Secondary | ICD-10-CM

## 2016-10-14 NOTE — ED Provider Notes (Signed)
CSN: [id number]     Arrival date & time 10/14/16  1512 History   First MD Initiated Contact with Patient 10/14/16 1804     Chief Complaint  Patient presents with  . Ankle Pain   (Consider location/radiation/quality/duration/timing/severity/associated sxs/prior Treatment) 45 year old female states that she was exiting the house and stepped down injuring her right foot. She states she is uncertain as to the exact mechanism and to why she injured her foot however after the 1 step she had to stop due to pain along the mid foot. This occurred at home around 5:00 this afternoon. Denies other injury. Denies fall.      Past Medical History:  Diagnosis Date  . Anemia   . Chronic cystitis   . GERD (gastroesophageal reflux disease)   . Multiple sclerosis (HCC)    Past Surgical History:  Procedure Laterality Date  . cesarain     Family History  Problem Relation Age of Onset  . Anemia Other   . Anemia Maternal Aunt   . Anemia Maternal Aunt    Social History  Substance Use Topics  . Smoking status: Never Smoker  . Smokeless tobacco: Never Used  . Alcohol use No   OB History    No data available     Review of Systems  Constitutional: Negative for activity change, chills and fever.  HENT: Negative.   Respiratory: Negative.   Cardiovascular: Negative.   Musculoskeletal:       As per HPI  Skin: Negative for color change, pallor and rash.  Neurological: Negative.   All other systems reviewed and are negative.   Allergies  Erythromycin and Guaifenesin & derivatives  Home Medications   Prior to Admission medications   Medication Sig Start Date End Date Taking? Authorizing Provider  buPROPion (WELLBUTRIN XL) 150 MG 24 hr tablet Take 150 mg by mouth daily.   Yes Historical Provider, MD  calcium carbonate (TUMS - DOSED IN MG ELEMENTAL CALCIUM) 500 MG chewable tablet Chew 1 tablet by mouth as needed.   Yes Historical Provider, MD  cholecalciferol (VITAMIN D) 1000 UNITS tablet  Take 2,000 Units by mouth daily.   Yes Historical Provider, MD  clonazePAM (KLONOPIN) 0.5 MG tablet Take 0.5 mg by mouth at bedtime as needed.    Yes Historical Provider, MD  Ferrous Sulfate (RA IRON) 27 MG TABS Take 1 tablet by mouth. "Nature's Choice" OTC   Yes Historical Provider, MD  fexofenadine (ALLEGRA) 30 MG tablet Take 30 mg by mouth as needed.   Yes Historical Provider, MD  Fingolimod HCl (GILENYA) 0.5 MG CAPS Take 1 each by mouth every other day.   Yes Historical Provider, MD  FLUoxetine (PROZAC) 40 MG capsule Take 80 mg by mouth daily.   Yes Historical Provider, MD  ibuprofen (ADVIL,MOTRIN) 600 MG tablet Take 1 tablet (600 mg total) by mouth every 6 (six) hours as needed. 11/05/15  Yes Tiffany Neva SeatGreene, PA-C  Levonorgest-Eth Estrad 91-Day (SEASONIQUE PO) Take 1 tablet by mouth daily.   Yes Historical Provider, MD  Multiple Vitamin (MULTIVITAMIN) capsule Take 1 capsule by mouth daily.   Yes Historical Provider, MD  polyethylene glycol (MIRALAX / GLYCOLAX) packet Take 17 g by mouth as needed.   Yes Historical Provider, MD  RABEprazole (ACIPHEX) 20 MG tablet Take 20 mg by mouth daily.   Yes Historical Provider, MD  vitamin B-12 (CYANOCOBALAMIN) 1000 MCG tablet Take 3,000 mcg by mouth daily.   Yes Historical Provider, MD  cyclobenzaprine (FLEXERIL) 10 MG tablet Take 0.5-1  tablets (5-10 mg total) by mouth 2 (two) times daily as needed. 11/05/15   Marlon Pel, PA-C  fish oil-omega-3 fatty acids 1000 MG capsule Take 1 g by mouth daily.    Historical Provider, MD  nitrofurantoin (MACRODANTIN) 25 MG capsule Take 25 mg by mouth daily.    Historical Provider, MD   Meds Ordered and Administered this Visit  Medications - No data to display  BP 133/73 (BP Location: Left Arm)   Pulse 91   Temp 98.8 F (37.1 C) (Oral)   Resp 16   SpO2 100%  No data found.   Physical Exam  Constitutional: She is oriented to person, place, and time. She appears well-developed and well-nourished. No distress.   HENT:  Head: Normocephalic and atraumatic.  Eyes: EOM are normal.  Neck: Normal range of motion. Neck supple.  Cardiovascular: Normal rate.   Pulmonary/Chest: Effort normal.  Musculoskeletal: She exhibits edema and tenderness. She exhibits no deformity.  Right foot with swelling and ecchymosis to the dorsal aspect that midfoot. Positive for local tenderness over the fifth meta-tarsal. No tenderness, swelling to the ankle. Full range of motion of the ankle. Distal neurovascular motor sensory is intact.  Lymphadenopathy:    She has no cervical adenopathy.  Neurological: She is alert and oriented to person, place, and time. No cranial nerve deficit.  Skin: Skin is warm and dry. Capillary refill takes less than 2 seconds.  Psychiatric: She has a normal mood and affect.  Nursing note and vitals reviewed.   Urgent Care Course   Clinical Course     Procedures (including critical care time)  Labs Review Labs Reviewed - No data to display  Imaging Review Dg Ankle Complete Right  Result Date: 10/14/2016 CLINICAL DATA:  Fall.  Right ankle pain. EXAM: RIGHT ANKLE - COMPLETE 3+ VIEW COMPARISON:  None. FINDINGS: Nondisplaced intra-articular base of right fifth metatarsal fracture, with overlying soft tissue swelling. No fracture or subluxation in the right ankle. No appreciable degenerative or erosive arthropathy. No suspicious focal osseous lesion. No radiopaque foreign body. IMPRESSION: 1. Nondisplaced intra-articular base of right fifth metatarsal fracture. 2. No fracture or subluxation in the right ankle. Electronically Signed   By: Delbert Phenix M.D.   On: 10/14/2016 17:26   Dg Foot Complete Right  Result Date: 10/14/2016 CLINICAL DATA:  Right foot pain after fall EXAM: RIGHT FOOT COMPLETE - 3+ VIEW COMPARISON:  None. FINDINGS: Comminuted nondisplaced intra-articular right base of fifth metatarsal fracture, with overlying soft tissue swelling. No additional right foot fracture. No  dislocation. No appreciable degenerative or erosive arthropathy. No suspicious focal osseous lesion. No radiopaque foreign body. IMPRESSION: Comminuted nondisplaced intra-articular base of right fifth metatarsal fracture. Electronically Signed   By: Delbert Phenix M.D.   On: 10/14/2016 17:27     Visual Acuity Review  Right Eye Distance:   Left Eye Distance:   Bilateral Distance:    Right Eye Near:   Left Eye Near:    Bilateral Near:         MDM   1. Closed nondisplaced fracture of fifth metatarsal bone of right foot, initial encounter    Rest, ice, elevation, Use crutches with no weight bearing for now. Wear wrap for comfort and support Call ortho tomorrow for appt.    Hayden Rasmussen, NP 10/14/16 1820

## 2016-10-14 NOTE — Discharge Instructions (Signed)
Rest, ice, elevation, Use crutches with no weight bearing for now. Wear wrap for comfort and support

## 2016-10-14 NOTE — ED Triage Notes (Signed)
Patient presents to [facility] today with family. She states she fell this afternoon- she twisted her ankle and hurts when she puts any weight on it.

## 2017-03-19 ENCOUNTER — Other Ambulatory Visit: Payer: Self-pay | Admitting: Psychiatry

## 2017-03-19 DIAGNOSIS — G35 Multiple sclerosis: Secondary | ICD-10-CM

## 2017-04-20 ENCOUNTER — Other Ambulatory Visit: Payer: BLUE CROSS/BLUE SHIELD

## 2017-04-27 ENCOUNTER — Ambulatory Visit
Admission: RE | Admit: 2017-04-27 | Discharge: 2017-04-27 | Disposition: A | Discharge status: Home / Self Care | Payer: BLUE CROSS/BLUE SHIELD | Source: Ambulatory Visit | Attending: Psychiatry | Admitting: Psychiatry

## 2017-04-27 DIAGNOSIS — G35 Multiple sclerosis: Secondary | ICD-10-CM

## 2017-04-27 MED ORDER — GADOBENATE DIMEGLUMINE 529 MG/ML IV SOLN
14.0000 mL | Freq: Once | INTRAVENOUS | Status: AC | PRN
  Administered 2017-04-27: 14 mL via INTRAVENOUS

## 2018-05-21 ENCOUNTER — Other Ambulatory Visit: Payer: Self-pay | Admitting: Psychiatry

## 2018-05-21 DIAGNOSIS — G35 Multiple sclerosis: Secondary | ICD-10-CM

## 2018-07-13 ENCOUNTER — Ambulatory Visit
Admission: RE | Admit: 2018-07-13 | Discharge: 2018-07-13 | Disposition: A | Discharge status: Home / Self Care | Payer: BLUE CROSS/BLUE SHIELD | Source: Ambulatory Visit | Attending: Psychiatry | Admitting: Psychiatry

## 2018-07-13 DIAGNOSIS — G35 Multiple sclerosis: Secondary | ICD-10-CM

## 2018-07-13 MED ORDER — GADOBENATE DIMEGLUMINE 529 MG/ML IV SOLN
15.0000 mL | Freq: Once | INTRAVENOUS | Status: AC | PRN
  Administered 2018-07-13: 15 mL via INTRAVENOUS

## 2019-11-16 ENCOUNTER — Other Ambulatory Visit: Payer: Self-pay | Admitting: Psychiatry

## 2019-11-16 DIAGNOSIS — G35 Multiple sclerosis: Secondary | ICD-10-CM

## 2020-12-12 ENCOUNTER — Other Ambulatory Visit: Payer: BLUE CROSS/BLUE SHIELD

## 2022-10-15 ENCOUNTER — Other Ambulatory Visit: Payer: Self-pay | Admitting: Nurse Practitioner

## 2022-10-15 DIAGNOSIS — G35D Multiple sclerosis, unspecified: Secondary | ICD-10-CM

## 2022-10-15 DIAGNOSIS — G35 Multiple sclerosis: Secondary | ICD-10-CM
